# Patient Record
Sex: Female | Born: 1965
Health system: Southern US, Community
[De-identification: ages and names within clinical notes are randomized; demographics above are authoritative.]

## PROBLEM LIST (undated history)

## (undated) DIAGNOSIS — T7840XA Allergy, unspecified, initial encounter: Secondary | ICD-10-CM

## (undated) DIAGNOSIS — E785 Hyperlipidemia, unspecified: Secondary | ICD-10-CM

## (undated) DIAGNOSIS — I10 Essential (primary) hypertension: Secondary | ICD-10-CM

## (undated) DIAGNOSIS — G43909 Migraine, unspecified, not intractable, without status migrainosus: Secondary | ICD-10-CM

## (undated) HISTORY — DX: Allergy, unspecified, initial encounter: T78.40XA

## (undated) HISTORY — DX: Hyperlipidemia, unspecified: E78.5

## (undated) HISTORY — DX: Essential (primary) hypertension: I10

---

## 1998-12-31 ENCOUNTER — Inpatient Hospital Stay (HOSPITAL_COMMUNITY): Admission: AD | Admit: 1998-12-31 | Discharge: 1999-01-03 | Payer: Self-pay | Admitting: Obstetrics and Gynecology

## 2000-07-30 ENCOUNTER — Other Ambulatory Visit: Admission: RE | Admit: 2000-07-30 | Discharge: 2000-07-30 | Payer: Self-pay | Admitting: Obstetrics and Gynecology

## 2000-12-17 ENCOUNTER — Other Ambulatory Visit: Admission: RE | Admit: 2000-12-17 | Discharge: 2000-12-17 | Payer: Self-pay | Admitting: Obstetrics and Gynecology

## 2001-01-13 ENCOUNTER — Inpatient Hospital Stay (HOSPITAL_COMMUNITY): Admission: AD | Admit: 2001-01-13 | Discharge: 2001-01-13 | Payer: Self-pay | Admitting: Obstetrics and Gynecology

## 2001-01-20 ENCOUNTER — Inpatient Hospital Stay (HOSPITAL_COMMUNITY): Admission: AD | Admit: 2001-01-20 | Discharge: 2001-01-23 | Payer: Self-pay | Admitting: Obstetrics and Gynecology

## 2004-03-14 ENCOUNTER — Other Ambulatory Visit: Admission: RE | Admit: 2004-03-14 | Discharge: 2004-03-14 | Payer: Self-pay | Admitting: Obstetrics and Gynecology

## 2012-05-25 ENCOUNTER — Other Ambulatory Visit: Payer: Self-pay | Admitting: Internal Medicine

## 2012-05-25 DIAGNOSIS — N644 Mastodynia: Secondary | ICD-10-CM

## 2012-05-31 ENCOUNTER — Ambulatory Visit
Admission: RE | Admit: 2012-05-31 | Discharge: 2012-05-31 | Disposition: A | Discharge status: Home / Self Care | Payer: BC Managed Care – PPO | Source: Ambulatory Visit | Attending: Internal Medicine | Admitting: Internal Medicine

## 2012-05-31 DIAGNOSIS — N644 Mastodynia: Secondary | ICD-10-CM

## 2014-09-20 ENCOUNTER — Ambulatory Visit (HOSPITAL_COMMUNITY)
Admission: RE | Admit: 2014-09-20 | Discharge: 2014-09-20 | Disposition: A | Discharge status: Home / Self Care | Payer: BC Managed Care – PPO | Source: Ambulatory Visit | Attending: Emergency Medicine | Admitting: Emergency Medicine

## 2014-09-20 ENCOUNTER — Ambulatory Visit (INDEPENDENT_AMBULATORY_CARE_PROVIDER_SITE_OTHER): Payer: BC Managed Care – PPO | Admitting: Emergency Medicine

## 2014-09-20 ENCOUNTER — Emergency Department (HOSPITAL_COMMUNITY)
Admission: EM | Admit: 2014-09-20 | Discharge: 2014-09-20 | Disposition: A | Discharge status: Home / Self Care | Payer: BC Managed Care – PPO | Attending: Emergency Medicine | Admitting: Emergency Medicine

## 2014-09-20 ENCOUNTER — Encounter (HOSPITAL_COMMUNITY): Payer: Self-pay | Admitting: Emergency Medicine

## 2014-09-20 ENCOUNTER — Other Ambulatory Visit: Payer: Self-pay | Admitting: Emergency Medicine

## 2014-09-20 ENCOUNTER — Emergency Department (HOSPITAL_COMMUNITY): Payer: BC Managed Care – PPO

## 2014-09-20 VITALS — BP 122/88 | HR 98 | Temp 97.6°F | Resp 16 | Ht 67.0 in | Wt 303.4 lb

## 2014-09-20 DIAGNOSIS — R270 Ataxia, unspecified: Secondary | ICD-10-CM

## 2014-09-20 DIAGNOSIS — I608 Other nontraumatic subarachnoid hemorrhage: Secondary | ICD-10-CM | POA: Diagnosis not present

## 2014-09-20 DIAGNOSIS — R51 Headache: Secondary | ICD-10-CM | POA: Diagnosis present

## 2014-09-20 DIAGNOSIS — G44039 Episodic paroxysmal hemicrania, not intractable: Secondary | ICD-10-CM

## 2014-09-20 DIAGNOSIS — I10 Essential (primary) hypertension: Secondary | ICD-10-CM | POA: Diagnosis not present

## 2014-09-20 DIAGNOSIS — R9402 Abnormal brain scan: Secondary | ICD-10-CM | POA: Diagnosis present

## 2014-09-20 DIAGNOSIS — E785 Hyperlipidemia, unspecified: Secondary | ICD-10-CM | POA: Diagnosis not present

## 2014-09-20 DIAGNOSIS — R519 Headache, unspecified: Secondary | ICD-10-CM

## 2014-09-20 DIAGNOSIS — Z79899 Other long term (current) drug therapy: Secondary | ICD-10-CM | POA: Insufficient documentation

## 2014-09-20 DIAGNOSIS — I609 Nontraumatic subarachnoid hemorrhage, unspecified: Secondary | ICD-10-CM

## 2014-09-20 LAB — CBC WITH DIFFERENTIAL/PLATELET
Basophils Absolute: 0.1 10*3/uL (ref 0.0–0.1)
Basophils Relative: 1 % (ref 0–1)
EOS PCT: 1 % (ref 0–5)
Eosinophils Absolute: 0.1 10*3/uL (ref 0.0–0.7)
HCT: 43.4 % (ref 36.0–46.0)
HEMOGLOBIN: 15.1 g/dL — AB (ref 12.0–15.0)
LYMPHS ABS: 2 10*3/uL (ref 0.7–4.0)
LYMPHS PCT: 25 % (ref 12–46)
MCH: 34.1 pg — ABNORMAL HIGH (ref 26.0–34.0)
MCHC: 34.8 g/dL (ref 30.0–36.0)
MCV: 98 fL (ref 78.0–100.0)
Monocytes Absolute: 0.6 10*3/uL (ref 0.1–1.0)
Monocytes Relative: 7 % (ref 3–12)
Neutro Abs: 5.2 10*3/uL (ref 1.7–7.7)
Neutrophils Relative %: 66 % (ref 43–77)
PLATELETS: 248 10*3/uL (ref 150–400)
RBC: 4.43 MIL/uL (ref 3.87–5.11)
RDW: 12.6 % (ref 11.5–15.5)
WBC: 7.8 10*3/uL (ref 4.0–10.5)

## 2014-09-20 LAB — BASIC METABOLIC PANEL
Anion gap: 15 (ref 5–15)
BUN: 18 mg/dL (ref 6–23)
CO2: 22 meq/L (ref 19–32)
Calcium: 9.9 mg/dL (ref 8.4–10.5)
Chloride: 99 mEq/L (ref 96–112)
Creatinine, Ser: 0.76 mg/dL (ref 0.50–1.10)
GFR calc Af Amer: >90 mL/min (ref >90)
GFR calc non Af Amer: >90 mL/min (ref >90)
Glucose, Bld: 182 mg/dL — ABNORMAL HIGH (ref 70–99)
POTASSIUM: 4 meq/L (ref 3.7–5.3)
Sodium: 136 mEq/L — ABNORMAL LOW (ref 137–147)

## 2014-09-20 MED ORDER — LORAZEPAM 2 MG/ML IJ SOLN
0.5000 mg | Freq: Once | INTRAMUSCULAR | Status: AC
  Administered 2014-09-20: 0.5 mg via INTRAVENOUS
  Filled 2014-09-20: qty 1

## 2014-09-20 MED ORDER — METOCLOPRAMIDE HCL 5 MG/ML IJ SOLN
5.0000 mg | Freq: Once | INTRAMUSCULAR | Status: AC
  Administered 2014-09-20: 5 mg via INTRAMUSCULAR
  Filled 2014-09-20: qty 2

## 2014-09-20 MED ORDER — IOHEXOL 350 MG/ML SOLN
100.0000 mL | Freq: Once | INTRAVENOUS | Status: AC | PRN
  Administered 2014-09-20: 100 mL via INTRAVENOUS

## 2014-09-20 MED ORDER — DIPHENHYDRAMINE HCL 50 MG/ML IJ SOLN
25.0000 mg | Freq: Once | INTRAMUSCULAR | Status: AC
  Administered 2014-09-20: 25 mg via INTRAVENOUS
  Filled 2014-09-20: qty 1

## 2014-09-20 MED ORDER — KETOROLAC TROMETHAMINE 30 MG/ML IJ SOLN
30.0000 mg | Freq: Once | INTRAMUSCULAR | Status: AC
  Administered 2014-09-20: 30 mg via INTRAVENOUS
  Filled 2014-09-20: qty 1

## 2014-09-20 MED ORDER — SODIUM CHLORIDE 0.9 % IV BOLUS (SEPSIS)
1000.0000 mL | Freq: Once | INTRAVENOUS | Status: AC
Start: 2014-09-20 — End: 2014-09-20
  Administered 2014-09-20: 1000 mL via INTRAVENOUS

## 2014-09-20 NOTE — ED Notes (Signed)
Patient transported to CT 

## 2014-09-20 NOTE — Patient Instructions (Signed)
[facility], today at 12:30 pm for outpatient MRI. Go through double door and turn right.
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
## 2014-09-20 NOTE — ED Notes (Signed)
Pt c/o right sided headache, visual disturbances for past couple of days.  Pt went to Urgent care today and was sent for MRI, the results were abnormal so pt was referred to ED for further eval.

## 2014-09-20 NOTE — ED Notes (Signed)
Prescription for migraines?

## 2014-09-20 NOTE — ED Provider Notes (Signed)
CSN: [id number]     Arrival date & time 09/20/14  1446 History   First MD Initiated Contact with Patient 09/20/14 1511     Chief Complaint  Patient presents with  . Sent here from Urgent Care due to abnormal MRI     (Consider location/radiation/quality/duration/timing/severity/associated sxs/prior Treatment) HPI   This is a 48 y/o F  With a pmh of migraine HAs. She has had years of intermittent migraine headaches with Visual disturbance in the Left eye. She states that she developed a headache on saturday on the Right side. She states that it is severe but no worse than her usual migraine headaches. Sh had associated ataxia and dizziness which are new but she attributes to her visual changes. [facility] Dr. Dareen PianoAnderson and sent for MRI which showed an apparent acute subarachnoid hemorrhage in the R parietal sulcus. She states that  She has a feeling of dread today.  Past Medical History  Diagnosis Date  . Allergy   . Hypertension   . Hyperlipidemia    History reviewed. No pertinent past surgical history. Family History  Problem Relation Age of Onset  . Cancer Mother   . Cancer Father   . Diabetes Father   . Hyperlipidemia Father   . Hypertension Father    History  Substance Use Topics  . Smoking status: Never Smoker   . Smokeless tobacco: Not on file  . Alcohol Use: 0.0 oz/week    0 Not specified per week   OB History    No data available     Review of Systems  Ten systems reviewed and are negative for acute change, except as noted in the HPI.    Allergies  Review of patient's allergies indicates no known allergies.  Home Medications   Prior to Admission medications   Medication Sig Start Date End Date Taking? Authorizing Provider  CALCIUM PO Take 2 tablets by mouth daily.   Yes Historical Provider, MD  Ibuprofen (ADVIL MIGRAINE) 200 MG CAPS Take 400 mg by mouth every 4 (four) hours as needed (migraine).   Yes Historical  Provider, MD  lisinopril-hydrochlorothiazide (PRINZIDE,ZESTORETIC) 20-25 MG per tablet Take 1 tablet by mouth daily.   Yes Historical Provider, MD  Multiple Vitamin (MULTIVITAMIN WITH MINERALS) TABS tablet Take 1 tablet by mouth daily.   Yes Historical Provider, MD  pravastatin (PRAVACHOL) 40 MG tablet Take 40 mg by mouth daily.   Yes Historical Provider, MD  Pseudoephedrine-Acetaminophen (TYLENOL SINUS MAX ST PO) Take 2 tablets by mouth every 4 (four) hours as needed (sinuses).   Yes Historical Provider, MD   BP 159/91 mmHg  Pulse 116  Temp(Src) 98.1 F (36.7 C) (Oral)  Resp 20  SpO2 98% Physical Exam  Constitutional: She is oriented to person, place, and time. She appears well-developed and well-nourished. No distress.  HENT:  Head: Normocephalic and atraumatic.  Mouth/Throat: Oropharynx is clear and moist.  Eyes: Conjunctivae and EOM are normal. Pupils are equal, round, and reactive to light. No scleral icterus.  No horizontal, vertical or rotational nystagmus  Neck: Normal range of motion. Neck supple.  Full active and passive ROM without pain No midline or paraspinal tenderness No nuchal rigidity or meningeal signs  Cardiovascular: Normal rate, regular rhythm and intact distal pulses.   Pulmonary/Chest: Effort normal and breath sounds normal. No respiratory distress. She has no wheezes. She has no rales.  Abdominal: Soft. Bowel sounds are normal. There is no tenderness. There is  no rebound and no guarding.  Musculoskeletal: Normal range of motion.  Lymphadenopathy:    She has no cervical adenopathy.  Neurological: She is alert and oriented to person, place, and time. She has normal reflexes. No cranial nerve deficit. She exhibits normal muscle tone. Coordination normal.  Mental Status:  Alert, oriented, thought content appropriate. Speech fluent without evidence of aphasia. Able to follow 2 step commands without difficulty.  Cranial Nerves:  II:  Peripheral visual fields grossly  normal, pupils equal, round, reactive to light III,IV, VI: ptosis not present, extra-ocular motions intact bilaterally  V,VII: smile symmetric, facial light touch sensation equal VIII: hearing grossly normal bilaterally  IX,X: gag reflex present  XI: bilateral shoulder shrug equal and strong XII: midline tongue extension  Motor:  5/5 in upper and lower extremities bilaterally including strong and equal grip strength and dorsiflexion/plantar flexion Sensory: Pinprick and light touch normal in all extremities.  Deep Tendon Reflexes: 2+ and symmetric  Cerebellar: normal finger-to-nose with bilateral upper extremities Gait: normal gait and balance CV: distal pulses palpable throughout   Skin: Skin is warm and dry. No rash noted. She is not diaphoretic.  Psychiatric: She has a normal mood and affect. Her behavior is normal. Judgment and thought content normal.  Nursing note and vitals reviewed.   ED Course  Procedures (including critical care time) Labs Review Labs Reviewed  CBC WITH DIFFERENTIAL  BASIC METABOLIC PANEL  URINALYSIS, ROUTINE W REFLEX MICROSCOPIC    Imaging Review Mr Brain Wo Contrast  09/20/2014   CLINICAL DATA:  Flashing lights and dizziness with ataxia. Yesterday developed severe RIGHT-sided headache which is atypical for the patient. Initial encounter.  EXAM: MRI HEAD WITHOUT CONTRAST  TECHNIQUE: Multiplanar, multiecho pulse sequences of the brain and surrounding structures were obtained without intravenous contrast.  COMPARISON:  None.  FINDINGS: There is a focus of acute subarachnoid hemorrhage within a RIGHT posterior parietal sulcus. No restricted diffusion, parenchymal hemorrhage, or visible vascular malformation. Evidence for T2 shortening on gradient sequence, but no T1 shortening, suggesting acute bleed.  No visible acute stroke, mass lesion, hydrocephalus, or extra-axial fluid.  Normal for age cerebral volume. Minor subcortical white matter signal abnormality,  nonspecific. No large vessel or lacunar infarct. Partial empty sella. No tonsillar herniation.  No sinus disease is evident.  Negative orbits.  No mastoid fluid.  IMPRESSION: Apparent acute subarachnoid hemorrhage within a RIGHT posterior parietal sulcus. Consider confirmation with CT. Etiology is undetermined. No visible intracranial vascular thrombosis, or parenchymal abnormality.  Findings discussed with ordering provider at the time of interpretation.   Electronically Signed   By: Davonna BellingJohn  Curnes M.D.   On: 09/20/2014 14:26     EKG Interpretation None      MDM   Final diagnoses:  Acute spontaneous subarachnoid intracranial hemorrhage    3:40 PM BP 159/91 mmHg  Pulse 116  Temp(Src) 98.1 F (36.7 C) (Oral)  Resp 20  SpO2 98% Dr. Wynetta Emeryram of neurosurgery has looked over the MRI ans states that this does not appear to an acute aneurysmal bleed. He requests CT/CTA. He states that this will not be a surgical case and she will need a neurology admission.    Patient CTA read as bleed, however, radiology called back to state that this is a calcification of some sort. Dr. Roseanne RenoStewart is aware and states the patient does not need admission for this. It appears it has been a long-standing issue. Patient informed of findings and will be treated with migraine cocktail.   8:01  PM Patient with resolution of her symptoms. She has a small headache now. However, there is no more. We will disturbance. She is able to walk easily in the emergency department. She will be discharged to follow up with her primary care physician.   Arthor Captain, PA-C 09/20/14 2002  Ward Givens, MD 09/20/14 [phone]

## 2014-09-20 NOTE — Discharge Instructions (Signed)
You are having a headache. No specific cause was found today for your headache. It may have been a migraine or other cause of headache. Stress, anxiety, fatigue, and depression are common triggers for headaches. Your headache today does not appear to be life-threatening or require hospitalization, but often the exact cause of headaches is not determined in the emergency department. Therefore, follow-up with your doctor is very important to find out what may have caused your headache, and whether or not you need any further diagnostic testing or treatment. Sometimes headaches can appear benign (not harmful), but then more serious symptoms can develop which should prompt an immediate re-evaluation by your doctor or the emergency department. °SEEK MEDICAL ATTENTION IF: °You develop possible problems with medications prescribed.  °The medications don't resolve your headache, if it recurs , or if you have multiple episodes of vomiting or can't take fluids. °You have a change from the usual headache. °RETURN IMMEDIATELY IF you develop a sudden, severe headache or confusion, become poorly responsive or faint, develop a fever above 100.4F or problem breathing, have a change in speech, vision, swallowing, or understanding, or develop new weakness, numbness, tingling, incoordination, or have a seizure. ° ° °Migraine Headache °A migraine headache is an intense, throbbing pain on one or both sides of your head. A migraine can last for 30 minutes to several hours. °CAUSES  °The exact cause of a migraine headache is not always known. However, a migraine may be caused when nerves in the brain become irritated and release chemicals that cause inflammation. This causes pain. °Certain things may also trigger migraines, such as: °· Alcohol. °· Smoking. °· Stress. °· Menstruation. °· Aged cheeses. °· Foods or drinks that contain nitrates, glutamate, aspartame, or tyramine. °· Lack of  sleep. °· Chocolate. °· Caffeine. °· Hunger. °· Physical exertion. °· Fatigue. °· Medicines used to treat chest pain (nitroglycerine), birth control pills, estrogen, and some blood pressure medicines. °SIGNS AND SYMPTOMS °· Pain on one or both sides of your head. °· Pulsating or throbbing pain. °· Severe pain that prevents daily activities. °· Pain that is aggravated by any physical activity. °· Nausea, vomiting, or both. °· Dizziness. °· Pain with exposure to bright lights, loud noises, or activity. °· General sensitivity to bright lights, loud noises, or smells. °Before you get a migraine, you may get warning signs that a migraine is coming (aura). An aura may include: °· Seeing flashing lights. °· Seeing bright spots, halos, or zigzag lines. °· Having tunnel vision or blurred vision. °· Having feelings of numbness or tingling. °· Having trouble talking. °· Having muscle weakness. °DIAGNOSIS  °A migraine headache is often diagnosed based on: °· Symptoms. °· Physical exam. °· A CT scan or MRI of your head. These imaging tests cannot diagnose migraines, but they can help rule out other causes of headaches. °TREATMENT °Medicines may be given for pain and nausea. Medicines can also be given to help prevent recurrent migraines.  °HOME CARE INSTRUCTIONS °· Only take over-the-counter or prescription medicines for pain or discomfort as directed by your health care provider. The use of long-term narcotics is not recommended. °· Lie down in a dark, quiet room when you have a migraine. °· Keep a journal to find out what may trigger your migraine headaches. For example, write down: °¨ What you eat and drink. °¨ How much sleep you get. °¨ Any change to your diet or medicines. °· Limit alcohol consumption. °· Quit smoking if you smoke. °· Get 7-9 hours   of sleep, or as recommended by your health care provider. °· Limit stress. °· Keep lights dim if bright lights bother you and make your migraines worse. °SEEK IMMEDIATE MEDICAL  CARE IF:  °· Your migraine becomes severe. °· You have a fever. °· You have a stiff neck. °· You have vision loss. °· You have muscular weakness or loss of muscle control. °· You start losing your balance or have trouble walking. °· You feel faint or pass out. °· You have severe symptoms that are different from your first symptoms. °MAKE SURE YOU:  °· Understand these instructions. °· Will watch your condition. °· Will get help right away if you are not doing well or get worse. °Document Released: 10/20/2005 Document Revised: 03/06/2014 Document Reviewed: 06/27/2013 °[employer]® Patient Information ©2015 [employer]. This information is not intended to replace advice given to you by your health care provider. Make sure you discuss any questions you have with your health care provider. ° °

## 2014-09-20 NOTE — ED Notes (Signed)
Bed: WA19 Expected date:  Expected time:  Means of arrival:  Comments: 

## 2014-09-20 NOTE — ED Notes (Signed)
Patient aware urine sample is needed, unable to give, will try again later.

## 2014-09-20 NOTE — Progress Notes (Signed)
[facility] [address], [place] [id number] [phone]  Date:  09/20/2014   Name:  Amber Durham   DOB:  01-19-1966   MRN:  [id number]  PCP:  No PCP Per Patient    Chief Complaint: Blurred Vision and Migraine   History of Present Illness:  Amber Durham is a 48 y.o. very pleasant female patient who presents with the following:  Patient has a history of "silent migraines" that she describes as auras and visual disturbance with no associated headache. These occur quarterly and headaches semi annually.  She had clusters of the visual symptoms over the weekend and  Yesterday developed a severe right sided headache that she says is different from her usual global headache.   She attributes this to a sinus problem in the absence of sinus complaints. Has no fever or chills, history of antecedent illness or injury. She has been ataxic today and uncoordinated by her description. She has no dizziness but feels her impairment is more vision related No improvement with over the counter medications or other home remedies.  Denies other complaint or health concern today.   There are no active problems to display for this patient.   Past Medical History  Diagnosis Date  . Allergy   . Hypertension   . Hyperlipidemia     History reviewed. No pertinent past surgical history.  History  Substance Use Topics  . Smoking status: Never Smoker   . Smokeless tobacco: Not on file  . Alcohol Use: 0.0 oz/week    0 Not specified per week    Family History  Problem Relation Age of Onset  . Cancer Mother   . Cancer Father   . Diabetes Father   . Hyperlipidemia Father   . Hypertension Father     No Known Allergies  Medication list has been reviewed and updated.  No current outpatient prescriptions on file prior to visit.   No current facility-administered medications on file prior to visit.    Review of Systems:  As per HPI, otherwise negative.     Physical Examination: Filed Vitals:   09/20/14 0958  BP: 122/88  Pulse: 98  Temp: 97.6 F (36.4 C)  Resp: 16   Filed Vitals:   09/20/14 0958  Height: 5\' 7"  (1.702 m)  Weight: 303 lb 6.4 oz (137.621 kg)   Body mass index is 47.51 kg/(m^2). Ideal Body Weight: Weight in (lb) to have BMI = 25: 159.3  GEN: morbidly obese, NAD, Non-toxic, A & O x 3 HEENT: Atraumatic, Normocephalic. Neck supple. No masses, No LAD. Ears and Nose: No external deformity. CV: RRR, No M/G/R. No JVD. No thrill. No extra heart sounds. PULM: CTA B, no wheezes, crackles, rhonchi. No retractions. No resp. distress. No accessory muscle use. ABD: S, NT, ND, +BS. No rebound. No HSM. EXTR: No c/c/e NEURO Normal gait. CN 2-12 intact,  PRRERLA EOMI romberg and tandem gait intact PSYCH: Normally interactive. Conversant. Not depressed or anxious appearing.  Calm demeanor.    Assessment and Plan: Intracranial hemorrhage To ER   Signed,  Phillips OdorJeffery Diamonte Stavely, MD   MRI IMPRESSION: Apparent acute subarachnoid hemorrhage within a RIGHT posterior parietal sulcus. Consider confirmation with CT. Etiology is undetermined. No visible intracranial vascular thrombosis, or parenchymal abnormality.

## 2014-09-20 NOTE — Consult Note (Signed)
Reason for Consult: Recurrent headache with abnormal CT scan.  HPI:                                                                                                                                          Amber Durham is an 48 y.o. female with a history of hypertension and hyperlipidemia as well as migraine headaches, presenting with headache that started 4 days ago involving right side of her head. She also had scotomas prior to the onset of headache. She's had some persistent blurring of vision on the left side. MRI of her brain was obtained which showed no signs of an acute stroke. It was abnormal signal involving the right parietal region which appeared to be possible acute hemorrhage. Subsequent CT scan of the head showed abnormal signal to be consistent with calcium deposits with no signs of acute hemorrhage. CT angiogram of the head was unremarkable.  Past Medical History  Diagnosis Date  . Allergy   . Hypertension   . Hyperlipidemia     History reviewed. No pertinent past surgical history.  Family History  Problem Relation Age of Onset  . Cancer Mother   . Cancer Father   . Diabetes Father   . Hyperlipidemia Father   . Hypertension Father     Social History:  reports that she has never smoked. She does not have any smokeless tobacco history on file. She reports that she drinks alcohol. She reports that she does not use illicit drugs.  No Known Allergies  MEDICATIONS:                                                                                                                     I have reviewed the patient's current medications.   ROS:  History obtained from the patient  General ROS: negative for - chills, fatigue, fever, night sweats, weight gain or weight loss Psychological ROS: negative for - behavioral disorder, hallucinations,  memory difficulties, mood swings or suicidal ideation Ophthalmic ROS: negative for - blurry vision, double vision, eye pain or loss of vision ENT ROS: negative for - epistaxis, nasal discharge, oral lesions, sore throat, tinnitus or vertigo Allergy and Immunology ROS: negative for - hives or itchy/watery eyes Hematological and Lymphatic ROS: negative for - bleeding problems, bruising or swollen lymph nodes Endocrine ROS: negative for - galactorrhea, hair pattern changes, polydipsia/polyuria or temperature intolerance Respiratory ROS: negative for - cough, hemoptysis, shortness of breath or wheezing Cardiovascular ROS: negative for - chest pain, dyspnea on exertion, edema or irregular heartbeat Gastrointestinal ROS: negative for - abdominal pain, diarrhea, hematemesis, nausea/vomiting or stool incontinence Genito-Urinary ROS: negative for - dysuria, hematuria, incontinence or urinary frequency/urgency Musculoskeletal ROS: negative for - joint swelling or muscular weakness Neurological ROS: as noted in HPI Dermatological ROS: negative for rash and skin lesion changes   Blood pressure 159/91, pulse 116, temperature 98.1 F (36.7 C), temperature source Oral, resp. rate 20, SpO2 98 %.   Neurologic Examination:                                                                                                      Mental Status: Alert, oriented, no acute distress.  Speech fluent without evidence of aphasia. Able to follow commands without difficulty. Cranial Nerves: II- difficulty with finger counting left visual field, otherwise unremarkable. III/IV/VI-Pupils were equal and reacted. Extraocular movements were full and conjugate.    V/VII-no facial numbness and no facial weakness. VIII-normal. X-normal speech and symmetrical palatal movement. Motor: 5/5 bilaterally with normal tone and bulk Sensory: Normal throughout. Deep Tendon Reflexes: Trace to 1+ and symmetric. Plantars: Flexor  bilaterally Cerebellar: Normal finger-to-nose testing.  No results found for: CHOL  Results for orders placed or performed during the hospital encounter of 09/20/14 (from the past 48 hour(s))  CBC with Differential     Status: Abnormal   Collection Time: 09/20/14  3:25 PM  Result Value Ref Range   WBC 7.8 4.0 - 10.5 K/uL   RBC 4.43 3.87 - 5.11 MIL/uL   Hemoglobin 15.1 (H) 12.0 - 15.0 g/dL   HCT 43.4 36.0 - 46.0 %   MCV 98.0 78.0 - 100.0 fL   MCH 34.1 (H) 26.0 - 34.0 pg   MCHC 34.8 30.0 - 36.0 g/dL   RDW 12.6 11.5 - 15.5 %   Platelets 248 150 - 400 K/uL   Neutrophils Relative % 66 43 - 77 %   Neutro Abs 5.2 1.7 - 7.7 K/uL   Lymphocytes Relative 25 12 - 46 %   Lymphs Abs 2.0 0.7 - 4.0 K/uL   Monocytes Relative 7 3 - 12 %   Monocytes Absolute 0.6 0.1 - 1.0 K/uL   Eosinophils Relative 1 0 - 5 %   Eosinophils Absolute 0.1 0.0 - 0.7 K/uL   Basophils Relative 1 0 - 1 %   Basophils Absolute  0.1 0.0 - 0.1 K/uL  Basic metabolic panel     Status: Abnormal   Collection Time: 09/20/14  3:25 PM  Result Value Ref Range   Sodium 136 (L) 137 - 147 mEq/L   Potassium 4.0 3.7 - 5.3 mEq/L   Chloride 99 96 - 112 mEq/L   CO2 22 19 - 32 mEq/L   Glucose, Bld 182 (H) 70 - 99 mg/dL   BUN 18 6 - 23 mg/dL   Creatinine, Ser 0.76 0.50 - 1.10 mg/dL   Calcium 9.9 8.4 - 10.5 mg/dL   GFR calc non Af Amer >90 >90 mL/min   GFR calc Af Amer >90 >90 mL/min    Comment: (NOTE) The eGFR has been calculated using the CKD EPI equation. This calculation has not been validated in all clinical situations. eGFR's persistently <90 mL/min signify possible Chronic Kidney Disease.    Anion gap 15 5 - 15    Ct Angio Head W/cm &/or Wo Cm  09/20/2014   CLINICAL DATA:  Subarachnoid hemorrhage demonstrated on previous MRI. Headache. Visual disturbance.  EXAM: CT ANGIOGRAPHY HEAD  TECHNIQUE: Multidetector CT imaging of the head was performed using the standard protocol during bolus administration of intravenous contrast.  Multiplanar CT image reconstructions and MIPs were obtained to evaluate the vascular anatomy.  CONTRAST:  175m OMNIPAQUE IOHEXOL 350 MG/ML SOLN  COMPARISON:  MRI same day.  FINDINGS: Images of the head show superficial calcification within the right parietal region, corresponding to the MR abnormality. Therefore, it is unlikely the patient has any acute subarachnoid hemorrhage. This chronic gyral calcification is usually associated with venous developmental anomaly and could represent a form of Sturge-Weber syndrome. However, the calcification appears more cortical and subcortical and there is not the associated finding of enlargement of the ipsilateral choroid. Therefore the differential diagnosis would include low level arteriovenous malformation, distant infection such as torch, old cortical vascular insult.  Both internal carotid arteries are widely patent through the skullbase. Both carotid siphon regions appear normal. The anterior and middle cerebral vessels are normal without proximal stenosis, aneurysm or vascular malformation. In the region of the right parietal calcification, there is no evidence of any high flow vascular lesion. I do think that there may be some prominent venous blush along some of the brain surface that is not associated with calcification, favoring the diagnosis of developmental venous anomaly.  Both vertebral arteries are widely patent to the basilar. No basilar stenosis. Posterior circulation branch vessels are normal.  No evidence of intracranial venous thrombosis of the sinuses.  Review of the MIP images confirms the above findings.  IMPRESSION: There is probably not acute subarachnoid hemorrhage in this case. The MRI finding was due to cortical calcification in the right parietal region. This is most likely due to either Sturge-Weber syndrome or developmental venous pathology with calcification. See above for full discussion. There is no evidence of high flow vascular  malformation or aneurysm.   Electronically Signed   By: MNelson ChimesM.D.   On: 09/20/2014 17:18   Ct Head Wo Contrast  09/20/2014   ADDENDUM REPORT: 09/20/2014 17:34  ADDENDUM: Upon further review, it is felt that the density of the abnormality in the right posterior parietal cortex is most consistent with calcification instead of hemorrhage. Please refer to CTA report of same day for further discussion.   Electronically Signed   By: JSabino DickM.D.   On: 09/20/2014 17:34   09/20/2014   CLINICAL DATA:  Acute spontaneous subarachnoid intracranial  hemorrhage.  EXAM: CT HEAD WITHOUT CONTRAST  TECHNIQUE: Contiguous axial images were obtained from the base of the skull through the vertex without intravenous contrast.  COMPARISON:  MRI scan of same day.  FINDINGS: Bony calvarium appears intact. No mass effect or midline shift is noted. High density material is noted in the sulci of the right posterior parietal cortex consistent with subarachnoid hemorrhage. Ventricular size is within normal limits. No evidence of acute infarction or mass is noted.  IMPRESSION: Subarachnoid hemorrhage is noted in the right posterior parietal cortex as described on prior MRI. No mass effect or midline shift is noted. Critical Value/emergent results were called by telephone at the time of interpretation on 09/20/2014 at 5:09 pm to Dr. Rulon Abide, who verbally acknowledged these results.  Electronically Signed: By: Sabino Dick M.D. On: 09/20/2014 17:10   Mr Brain Wo Contrast  09/20/2014   CLINICAL DATA:  Flashing lights and dizziness with ataxia. Yesterday developed severe RIGHT-sided headache which is atypical for the patient. Initial encounter.  EXAM: MRI HEAD WITHOUT CONTRAST  TECHNIQUE: Multiplanar, multiecho pulse sequences of the brain and surrounding structures were obtained without intravenous contrast.  COMPARISON:  None.  FINDINGS: There is a focus of acute subarachnoid hemorrhage within a RIGHT posterior parietal  sulcus. No restricted diffusion, parenchymal hemorrhage, or visible vascular malformation. Evidence for T2 shortening on gradient sequence, but no T1 shortening, suggesting acute bleed.  No visible acute stroke, mass lesion, hydrocephalus, or extra-axial fluid.  Normal for age cerebral volume. Minor subcortical white matter signal abnormality, nonspecific. No large vessel or lacunar infarct. Partial empty sella. No tonsillar herniation.  No sinus disease is evident.  Negative orbits.  No mastoid fluid.  IMPRESSION: Apparent acute subarachnoid hemorrhage within a RIGHT posterior parietal sulcus. Consider confirmation with CT. Etiology is undetermined. No visible intracranial vascular thrombosis, or parenchymal abnormality.  Findings discussed with ordering provider at the time of interpretation.   Electronically Signed   By: Rolla Flatten M.D.   On: 09/20/2014 14:26   Assessment/Plan: 48 year old lady with history of migraine headaches presenting with a recurrent headache with imaging studies of the brain showing findings consistent with chronic focal calcification involving right parietal region. Duration of calcium deposits is unclear and possibly even congenital. Patient has no indication of intracranial abnormality otherwise. Blurring of vision involving left visual field is atypical feature of her migraine headache symptomatology.  No further neurodiagnostic studies are indicated. Recommend symptomatic treatment of recurrent headache.  C.R. Nicole Kindred, MD [facility] [phone]  09/20/2014, 5:38 PM

## 2014-09-21 ENCOUNTER — Telehealth: Payer: Self-pay

## 2014-09-21 ENCOUNTER — Encounter (HOSPITAL_COMMUNITY): Payer: Self-pay | Admitting: *Deleted

## 2014-09-21 ENCOUNTER — Other Ambulatory Visit: Payer: Self-pay | Admitting: Emergency Medicine

## 2014-09-21 ENCOUNTER — Emergency Department (HOSPITAL_COMMUNITY)
Admission: EM | Admit: 2014-09-21 | Discharge: 2014-09-21 | Disposition: A | Discharge status: Home / Self Care | Payer: BC Managed Care – PPO | Attending: Emergency Medicine | Admitting: Emergency Medicine

## 2014-09-21 DIAGNOSIS — G43009 Migraine without aura, not intractable, without status migrainosus: Secondary | ICD-10-CM

## 2014-09-21 DIAGNOSIS — Z79899 Other long term (current) drug therapy: Secondary | ICD-10-CM | POA: Insufficient documentation

## 2014-09-21 DIAGNOSIS — E785 Hyperlipidemia, unspecified: Secondary | ICD-10-CM | POA: Diagnosis not present

## 2014-09-21 DIAGNOSIS — G43909 Migraine, unspecified, not intractable, without status migrainosus: Secondary | ICD-10-CM | POA: Insufficient documentation

## 2014-09-21 DIAGNOSIS — I10 Essential (primary) hypertension: Secondary | ICD-10-CM | POA: Insufficient documentation

## 2014-09-21 HISTORY — DX: Migraine, unspecified, not intractable, without status migrainosus: G43.909

## 2014-09-21 MED ORDER — SODIUM CHLORIDE 0.9 % IV BOLUS (SEPSIS)
1000.0000 mL | Freq: Once | INTRAVENOUS | Status: AC
  Administered 2014-09-21: 1000 mL via INTRAVENOUS

## 2014-09-21 MED ORDER — DIPHENHYDRAMINE HCL 50 MG/ML IJ SOLN
50.0000 mg | Freq: Once | INTRAMUSCULAR | Status: AC
  Administered 2014-09-21: 50 mg via INTRAVENOUS
  Filled 2014-09-21: qty 1

## 2014-09-21 MED ORDER — KETOROLAC TROMETHAMINE 30 MG/ML IJ SOLN
30.0000 mg | Freq: Once | INTRAMUSCULAR | Status: AC
  Administered 2014-09-21: 30 mg via INTRAVENOUS
  Filled 2014-09-21: qty 1

## 2014-09-21 MED ORDER — METOCLOPRAMIDE HCL 5 MG/ML IJ SOLN
10.0000 mg | Freq: Once | INTRAMUSCULAR | Status: AC
  Administered 2014-09-21: 10 mg via INTRAVENOUS
  Filled 2014-09-21: qty 2

## 2014-09-21 MED ORDER — DEXAMETHASONE SODIUM PHOSPHATE 10 MG/ML IJ SOLN
10.0000 mg | Freq: Once | INTRAMUSCULAR | Status: AC
  Administered 2014-09-21: 10 mg via INTRAVENOUS
  Filled 2014-09-21: qty 1

## 2014-09-21 MED ORDER — ONDANSETRON 8 MG PO TBDP: 8.0000 mg | ORAL_TABLET | Freq: Three times a day (TID) | ORAL | Status: DC | PRN

## 2014-09-21 MED ORDER — SUMATRIPTAN SUCCINATE 100 MG PO TABS: 100.0000 mg | ORAL_TABLET | Freq: Once | ORAL | Status: DC

## 2014-09-21 MED ORDER — DICLOFENAC POTASSIUM(MIGRAINE) 50 MG PO PACK: 50.0000 mg | PACK | Freq: Every day | ORAL | Status: DC | PRN

## 2014-09-21 MED ORDER — TETRACAINE HCL 0.5 % OP SOLN
1.0000 [drp] | Freq: Once | OPHTHALMIC | Status: AC
  Administered 2014-09-21: 1 [drp] via OPHTHALMIC
  Filled 2014-09-21: qty 2

## 2014-09-21 NOTE — ED Notes (Signed)
Pt c/o visual color flashes and "borders".  Pt states she was [facility] and discharged after CT and MRI showed calcification that was the probable cause of s/s.  She knows that what she is experiencing is a migraine, but she can't function, b/c she can barely walk d/t visual changes.

## 2014-09-21 NOTE — Telephone Encounter (Signed)
done

## 2014-09-21 NOTE — Telephone Encounter (Signed)
Dr Dareen PianoAnderson, please review.

## 2014-09-21 NOTE — Telephone Encounter (Signed)
Thank you, Dr Dareen PianoAnderson. I see the Rxs for zofran and Imitrex, do you also want to Rx a preventative medication for migraines as req'd?

## 2014-09-21 NOTE — Telephone Encounter (Signed)
No not for headaches that only come quarterly.  Why not make an appt with neuro

## 2014-09-21 NOTE — Telephone Encounter (Signed)
PATIENT STATES SHE CAME IN AND SAW DR. Nelia Durham YESTERDAY FOR DIZZINESS. HE SENT HER TO HAVE A MRI OF HER BRAIN. SHE DID NOT KNOW IF THEY GAVE DR. ANDERSON THE RESULTS YET, BUT SHE DIDN'T HAVE BLEEDING ON HER BRAIN. IT WAS CALCIUM. THEY TOLD HER THAT DR. ANDERSON NEEDS TO CALL HER IN A PREVENTATIVE MIGRAINE MEDICINE AND AN ABORTIVE. PLEASE CALL HER BACK AS SOON AS POSSIBLE. BEST PHONE [phone] (HOME)  PHARMACY CHOICE IS [facility] ON [address].  MBC

## 2014-09-21 NOTE — Telephone Encounter (Signed)
Spoke w/pt who is back in the ED being seen again. She hopes that they will have a neurologist see her today but agreed to CB if she needs a referral. Advised about Rxs.

## 2014-09-21 NOTE — ED Provider Notes (Signed)
TIME SEEN: 6:28 PM  CHIEF COMPLAINT: Headache  HPI: Pt is a 48 y.o. F with history of hypertension, hyperlipidemia, migraines who presents to the emergency department with complaints of 2 days of a right-sided throbbing headache that is similar to her prior migraines. She states she has had migraines and she was 48 years old and she will have episodes of seeing flashing colors, borders around her vision and have some spots of vision loss. She states this is normal for her migraines. Yesterday patient had an MRI of her brain that was concerning for possible subarachnoid hemorrhage. CT angio of her head showed that this was actually calcification and there was no bleed. She was seen at that time by Dr. Roseanne RenoStewart of neurology who agreed that these were complicated migraines and recommended since that treatment. Patient received medications and reported her symptoms have improved and now her headache is worse and she is unable to function because of her visual changes. No recent head injury, fevers, neck pain or neck stiffness, numbness, tingling or focal weakness.  She reports that ibuprofen is normally sufficient to manage her headaches. She states she has had Imitrex in the past and it made her feel worse.  ROS: See HPI Constitutional: no fever  Eyes: no drainage  ENT: no runny nose   Cardiovascular:  no chest pain  Resp: no SOB  GI: no vomiting GU: no dysuria Integumentary: no rash  Allergy: no hives  Musculoskeletal: no leg swelling  Neurological: no slurred speech ROS otherwise negative  PAST MEDICAL HISTORY/PAST SURGICAL HISTORY:  Past Medical History  Diagnosis Date  . Allergy   . Hypertension   . Hyperlipidemia   . Migraines     MEDICATIONS:  Prior to Admission medications   Medication Sig Start Date End Date Taking? Authorizing Provider  CALCIUM PO Take 2 tablets by mouth daily.   Yes Historical Provider, MD  Ibuprofen (ADVIL MIGRAINE) 200 MG CAPS Take 400 mg by mouth every 4  (four) hours as needed (migraine).   Yes Historical Provider, MD  lisinopril-hydrochlorothiazide (PRINZIDE,ZESTORETIC) 20-25 MG per tablet Take 1 tablet by mouth daily.   Yes Historical Provider, MD  Multiple Vitamin (MULTIVITAMIN WITH MINERALS) TABS tablet Take 1 tablet by mouth daily.   Yes Historical Provider, MD  ondansetron (ZOFRAN-ODT) 8 MG disintegrating tablet Take 1 tablet (8 mg total) by mouth every 8 (eight) hours as needed for nausea. 09/21/14  Yes Carmelina DaneJeffery S Anderson, MD  pravastatin (PRAVACHOL) 40 MG tablet Take 40 mg by mouth daily.   Yes Historical Provider, MD  Pseudoephedrine-Acetaminophen (TYLENOL SINUS MAX ST PO) Take 2 tablets by mouth every 4 (four) hours as needed (sinuses).   Yes Historical Provider, MD  SUMAtriptan (IMITREX) 100 MG tablet Take 1 tablet (100 mg total) by mouth once. May repeat in 2 hours if headache persists or recurs. 09/21/14  Yes Carmelina DaneJeffery S Anderson, MD    ALLERGIES:  No Known Allergies  SOCIAL HISTORY:  History  Substance Use Topics  . Smoking status: Never Smoker   . Smokeless tobacco: Not on file  . Alcohol Use: 0.0 oz/week    0 Not specified per week    FAMILY HISTORY: Family History  Problem Relation Age of Onset  . Cancer Mother   . Cancer Father   . Diabetes Father   . Hyperlipidemia Father   . Hypertension Father     EXAM: BP 121/80 mmHg  Pulse 105  Temp(Src) 98.7 F (37.1 C) (Oral)  Resp 16  Ht  5\' 7"  (1.702 m)  Wt 306 lb (138.801 kg)  BMI 47.92 kg/m2  SpO2 97% CONSTITUTIONAL: Alert and oriented and responds appropriately to questions. Well-appearing; well-nourished HEAD: Normocephalic EYES: Conjunctivae clear, PERRL; no photophobia, ask her Movements intact and painless, no pain with consensual light response, funduscopic exam appears normal with no papilledema or obvious hemorrhage, intraocular pressure in the right eye is 14 mmHg, intraocular pressure in the left eye is 12 mmHg ENT: normal nose; no rhinorrhea; moist  mucous membranes; pharynx without lesions noted NECK: Supple, no meningismus, no LAD  CARD: RRR; S1 and S2 appreciated; no murmurs, no clicks, no rubs, no gallops RESP: Normal chest excursion without splinting or tachypnea; breath sounds clear and equal bilaterally; no wheezes, no rhonchi, no rales,  ABD/GI: Normal bowel sounds; non-distended; soft, non-tender, no rebound, no guarding BACK:  The back appears normal and is non-tender to palpation, there is no CVA tenderness EXT: Normal ROM in all joints; non-tender to palpation; no edema; normal capillary refill; no cyanosis    SKIN: Normal color for age and race; warm NEURO: Moves all extremities equally; cranial nerves II through XII intact, no dysmetria to finger to nose testing, normal gait, sensation to light touch intact diffusely, strength 5/5 in all 4 extremities, normal visual fields PSYCH: The patient's mood and manner are appropriate. Grooming and personal hygiene are appropriate.  MEDICAL DECISION MAKING: Pt here with complicated migraine. She is neurologically intact. Hemodynamically stable. She has had recent head imaging yesterday that was concerning for possible hemorrhage although a CT angio showed that the area possible bleed was actually an area of calcification. She was seen by neurology. She is back today because she is unable to control her headache at home.  She is very well-appearing, smiling, nontoxic, neurologically intact. I do not feel she needs repeat head imaging today. She states that she has had exact same headaches since she was 48 years old the only difference today is that it is lasting 2 days when normally she is able to get rid of them within several hours with ibuprofen. We'll give Decadron, IV fluids, Toradol, Reglan and Benadryl and reassess.  ED PROGRESS: 8:11 PM  Pt reports her headache is completely gone but she is still having bilateral flashing colors, spots in vision loss. Denies eye pain or eye injury. Her  funduscopic exam appears normal with no papilledema or obvious hemorrhage. I doubt that this is ophthalmological emergency given his bilateral and more likely related to her migraines.  She states the symptoms are similar to vision changes that she is having prior migraines before but she is unable to drive, function, ambulate normally because of her vision changes. We'll discuss with neurology for further recommendations. We'll check eye pressures and visual acuity.   8:39 PM  Pt reports that her vision changes have slightly improved and they are intermittent, lasting several seconds and then resolving. She does state she has "blind spots" in both eyes and she will have flashing lights intermittently. Her eye pressures are 12 and 14.   9:08 PM  Pt able to ambulate without any difficulty or needing assistance.  9:23 PM  D/w Dr. Hosie PoissonSumner for recommendations for symptomatically treatment. He recommends starting the patient on cambia as needed for headaches. He recommends outpatient neurology follow-up as she may need to be started on preventative medications for her headaches. Have discussed with patient again that I recommend follow-up with her ophthalmologist this week. I do not feel there is any current ophthalmologic  emergency. She states she is feeling better. We'll discharge home.  Layla Maw Ward, DO 09/21/14 2124

## 2014-09-21 NOTE — Discharge Instructions (Signed)

## 2014-09-21 NOTE — Telephone Encounter (Signed)
Pt would also like something for nausea

## 2014-09-22 ENCOUNTER — Ambulatory Visit (INDEPENDENT_AMBULATORY_CARE_PROVIDER_SITE_OTHER): Payer: BC Managed Care – PPO | Admitting: Emergency Medicine

## 2014-09-22 ENCOUNTER — Telehealth: Payer: Self-pay

## 2014-09-22 VITALS — BP 130/92 | HR 84 | Temp 98.3°F | Resp 16 | Ht 64.0 in | Wt 296.0 lb

## 2014-09-22 DIAGNOSIS — G43109 Migraine with aura, not intractable, without status migrainosus: Secondary | ICD-10-CM

## 2014-09-22 MED ORDER — TOPIRAMATE 50 MG PO TABS: ORAL_TABLET | ORAL | Status: AC

## 2014-09-22 MED ORDER — DICLOFENAC SODIUM 75 MG PO TBEC: DELAYED_RELEASE_TABLET | ORAL | Status: DC

## 2014-09-22 NOTE — Progress Notes (Signed)
[facility] [address], [place] [address] [phone]  Date:  09/22/2014   Name:  Amber StarksCamilla E Durham   DOB:  07/26/1966   MRN:  [id number]  PCP:  No PCP Per Patient    Chief Complaint: Follow-up   History of Present Illness:  Amber StarksCamilla E Durham is a 48 y.o. very pleasant female patient who presents with the following:  History of complicated visual migraines.  Seen and sent for a MRI and thought to have a SAH.  Subsequently to the ER and had a CT followed by a CTA which was negative She was seen by a neurologist in the ER who recommended outpatient followup She has found relief of her pain with imitrex but is still bothered by what sounds like a cluster of visual migraines At the recommendation of the neurologist, she was started on topamax.  Patient Active Problem List   Diagnosis Date Noted  . Abnormal brain scan 09/20/2014  . Essential hypertension 09/20/2014  . HA (headache)     Past Medical History  Diagnosis Date  . Allergy   . Hypertension   . Hyperlipidemia   . Migraines     History reviewed. No pertinent past surgical history.  History  Substance Use Topics  . Smoking status: Never Smoker   . Smokeless tobacco: Not on file  . Alcohol Use: 0.0 oz/week    0 Not specified per week    Family History  Problem Relation Age of Onset  . Cancer Mother   . Cancer Father   . Diabetes Father   . Hyperlipidemia Father   . Hypertension Father     No Known Allergies  Medication list has been reviewed and updated.  Current Outpatient Prescriptions on File Prior to Visit  Medication Sig Dispense Refill  . CALCIUM PO Take 2 tablets by mouth daily.    . Diclofenac Potassium (CAMBIA) 50 MG PACK Take 50 mg by mouth daily as needed (headache). 1 each 1  . Ibuprofen (ADVIL MIGRAINE) 200 MG CAPS Take 400 mg by mouth every 4 (four) hours as needed (migraine).    Marland Kitchen. lisinopril-hydrochlorothiazide (PRINZIDE,ZESTORETIC) 20-25 MG per tablet Take 1  tablet by mouth daily.    . Multiple Vitamin (MULTIVITAMIN WITH MINERALS) TABS tablet Take 1 tablet by mouth daily.    . ondansetron (ZOFRAN-ODT) 8 MG disintegrating tablet Take 1 tablet (8 mg total) by mouth every 8 (eight) hours as needed for nausea. 30 tablet 0  . pravastatin (PRAVACHOL) 40 MG tablet Take 40 mg by mouth daily.    . Pseudoephedrine-Acetaminophen (TYLENOL SINUS MAX ST PO) Take 2 tablets by mouth every 4 (four) hours as needed (sinuses).    . SUMAtriptan (IMITREX) 100 MG tablet Take 1 tablet (100 mg total) by mouth once. May repeat in 2 hours if headache persists or recurs. 10 tablet 0   No current facility-administered medications on file prior to visit.    Review of Systems:  As per HPI, otherwise negative.    Physical Examination: Filed Vitals:   09/22/14 1432  BP: 130/92  Pulse: 84  Temp: 98.3 F (36.8 C)  Resp: 16   Filed Vitals:   09/22/14 1432  Height: 5\' 4"  (1.626 m)  Weight: 296 lb (134.265 kg)   Body mass index is 50.78 kg/(m^2). Ideal Body Weight: Weight in (lb) to have BMI = 25: 145.3   GEN: WDWN, NAD, Non-toxic, Alert & Oriented x 3 HEENT: Atraumatic, Normocephalic.  Ears and Nose: No  external deformity. EXTR: No clubbing/cyanosis/edema NEURO: Normal gait.  PSYCH: Normally interactive. Conversant. Not depressed or anxious appearing.  Calm demeanor.    Assessment and Plan: Visual migraines Follow up with ophth and neurology Start topamax increasing stepwise to 200 bid. Follow up in one month  Signed,  Phillips OdorJeffery Tyshawna Alarid, MD

## 2014-09-22 NOTE — Patient Instructions (Signed)

## 2014-09-22 NOTE — Telephone Encounter (Signed)
Pt called in and states she is in the hospital and the Neurologist there wants Dr Dareen PianoAnderson to prescribe a preventive migraine medication for her. And she also needs a referral for an out pt Nuerologist.She can be reached @ [phone]. Thank you

## 2014-09-22 NOTE — Telephone Encounter (Signed)
Pt advised to come into the office to discuss medication. Pt will be in today.

## 2014-10-02 ENCOUNTER — Telehealth: Payer: Self-pay

## 2014-10-02 NOTE — Telephone Encounter (Signed)
Pt states she had blood work done and think she may need some BP and CH0L medicine called in. Please call pt at [phone]     [facility] [address]

## 2014-10-02 NOTE — Telephone Encounter (Signed)
Pt states she was told she had to come in, but would like to have at least 5 pills until she is able to come in on Friday. Please call [phone]     [facility] [address]

## 2014-10-02 NOTE — Telephone Encounter (Signed)
LM for pt to RTC-  

## 2014-10-02 NOTE — Telephone Encounter (Signed)
Last OV for pt was in regards to migraine no labs drawn- LM to advise pt to RTC

## 2014-10-24 ENCOUNTER — Telehealth: Payer: Self-pay | Admitting: Neurology

## 2014-10-24 NOTE — Telephone Encounter (Signed)
Pt has already seen someone and canceled appt fro 10-26-14

## 2014-10-26 ENCOUNTER — Ambulatory Visit: Payer: BC Managed Care – PPO | Admitting: Neurology

## 2014-10-28 ENCOUNTER — Ambulatory Visit (INDEPENDENT_AMBULATORY_CARE_PROVIDER_SITE_OTHER): Payer: BC Managed Care – PPO | Admitting: Family Medicine

## 2014-10-28 VITALS — BP 128/80 | HR 92 | Temp 99.1°F | Resp 18 | Ht 65.0 in | Wt 302.0 lb

## 2014-10-28 DIAGNOSIS — B360 Pityriasis versicolor: Secondary | ICD-10-CM

## 2014-10-28 DIAGNOSIS — R739 Hyperglycemia, unspecified: Secondary | ICD-10-CM

## 2014-10-28 DIAGNOSIS — E785 Hyperlipidemia, unspecified: Secondary | ICD-10-CM

## 2014-10-28 DIAGNOSIS — E118 Type 2 diabetes mellitus with unspecified complications: Secondary | ICD-10-CM

## 2014-10-28 DIAGNOSIS — I10 Essential (primary) hypertension: Secondary | ICD-10-CM

## 2014-10-28 DIAGNOSIS — G43809 Other migraine, not intractable, without status migrainosus: Secondary | ICD-10-CM

## 2014-10-28 LAB — POCT CBC
Granulocyte percent: 52.7 %G (ref 37–80)
HEMATOCRIT: 43.5 % (ref 37.7–47.9)
HEMOGLOBIN: 14.9 g/dL (ref 12.2–16.2)
LYMPH, POC: 2.7 (ref 0.6–3.4)
MCH: 33.9 pg — AB (ref 27–31.2)
MCHC: 34.3 g/dL (ref 31.8–35.4)
MCV: 99.1 fL — AB (ref 80–97)
MID (cbc): 0.5 (ref 0–0.9)
MPV: 6.7 fL (ref 0–99.8)
POC Granulocyte: 3.6 (ref 2–6.9)
POC LYMPH PERCENT: 39.5 %L (ref 10–50)
POC MID %: 7.8 % (ref 0–12)
Platelet Count, POC: 278 10*3/uL (ref 142–424)
RBC: 4.39 M/uL (ref 4.04–5.48)
RDW, POC: 12.6 %
WBC: 6.8 10*3/uL (ref 4.6–10.2)

## 2014-10-28 LAB — POCT GLYCOSYLATED HEMOGLOBIN (HGB A1C): Hemoglobin A1C: 7.7

## 2014-10-28 LAB — GLUCOSE, POCT (MANUAL RESULT ENTRY): POC Glucose: 174 mg/dl — AB (ref 70–99)

## 2014-10-28 MED ORDER — METFORMIN HCL 500 MG PO TABS: 500.0000 mg | ORAL_TABLET | Freq: Two times a day (BID) | ORAL | Status: AC

## 2014-10-28 MED ORDER — PRAVASTATIN SODIUM 40 MG PO TABS: 40.0000 mg | ORAL_TABLET | Freq: Every day | ORAL | Status: AC

## 2014-10-28 MED ORDER — LISINOPRIL-HYDROCHLOROTHIAZIDE 20-25 MG PO TABS: 1.0000 | ORAL_TABLET | Freq: Every day | ORAL | Status: AC

## 2014-10-28 MED ORDER — ONETOUCH ULTRA SYSTEM W/DEVICE KIT: 1.0000 | PACK | Freq: Once | Status: AC

## 2014-10-28 NOTE — Patient Instructions (Addendum)
Read the American diabetic Association website  You can look up tinea versicolor and see, but I think it is worth trying a little lunch when cream on on the rash every day for 3 or 4 weeks. The skin may not return to a normal pigmented color for several months sometimes but after 3 or 4 weeks the infection all to be well resolved.  Continue taking your aspirin daily, cholesterol medication, and blood pressure medicine as they help protect your body from adverse effects of the diabetes.  Turn in about 6 weeks to see Dr. Dareen PianoAnderson, sooner if problems  Begin metformin 1 each morning for a few days, then one twice daily for a couple of weeks, then 2 in the morning and one in the evening, then 2 twice daily.  Check sugar fasting in the morning and 2-3 hours after a main meal. You can find cell phone to keep a record of this and show your physician your numbers

## 2014-10-28 NOTE — Progress Notes (Signed)
Subjective: Patient is here for a refill of her cholesterol and blood pressure medication. She's been doing pretty well except for her migraine problems. She was in the hospital. She had a couple of CT scans and MRI. They thought she had an intracranial bleed, but it turned out it was a venous malformation. She is seeing a neurologist for this. [facility] 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 She has never been told. I went back and reviewed her old records and labs that she had earlier last month showed an elevated glucose. She does not get regular exercise much. She does do some walking. She likes to eat, but does not like a lot of sugar. She was told that the place on the chest wall might be a caf au lait spot. She has not had it all of her life. Still has some dizziness   Objective: Rash at the manubrium area of the chest, looks like it could be tinea versicolor. Her eyes are PERRLA. Throat clear. Neck supple without nodes thyromegaly. No carotid bruits. Chest is clear to auscultation. Heart regular without murmurs gallops or arrhythmias. No edema.  Assessment: Hypertension Hyperlipidemia Hyperglycemia Atypical migraines Venous malformation in the brain  Plan: Check labs  Results for orders placed or performed in visit on 10/28/14  POCT CBC  Result Value Ref Range   WBC 6.8 4.6 - 10.2 K/uL   Lymph, poc 2.7 0.6 - 3.4   POC LYMPH PERCENT 39.5 10 - 50 %L   MID (cbc) 0.5 0 - 0.9   POC MID % 7.8 0 - 12 %M   POC Granulocyte 3.6 2 - 6.9   Granulocyte percent 52.7 37 - 80 %G   RBC 4.39 4.04 - 5.48 M/uL   Hemoglobin 14.9 12.2 - 16.2 g/dL   HCT, POC 98.143.5 19.137.7 - 47.9 %   MCV 99.1 (A) 80 - 97 fL   MCH, POC 33.9 (A) 27 - 31.2 pg   MCHC 34.3 31.8 - 35.4 g/dL   RDW, POC 47.812.6 %   Platelet Count, POC 278 142 - 424 K/uL   MPV 6.7 0 - 99.8 fL  POCT glucose (manual entry)  Result Value Ref Range   POC Glucose 174 (A) 70 - 99 mg/dl  POCT glycosylated hemoglobin  (Hb A1C)  Result Value Ref Range   Hemoglobin A1C 7.7    Assessment: New-onset diabetes (newly diagnosed) Obesity Possible tinea versicolor  Plan: A long talk with her about diabetes want to read exercise and diet. We'll get her on metformin. She chooses see Dr. Dareen PianoAnderson as her PCP here, and was told her that would be fine. [facility]    I think that that spot on the chest wall is tinea versicolor possibly suggested try using some Lotrimin cream on that and see if it would clear. Be caf au lait, but doesn't look like typical.

## 2014-10-29 LAB — LIPID PANEL
CHOL/HDL RATIO: 4.1 ratio
Cholesterol: 219 mg/dL — ABNORMAL HIGH (ref 0–200)
HDL: 53 mg/dL (ref >39)
Triglycerides: 422 mg/dL — ABNORMAL HIGH (ref <150)

## 2014-10-29 LAB — COMPLETE METABOLIC PANEL WITH GFR
ALK PHOS: 106 U/L (ref 39–117)
ALT: 68 U/L — ABNORMAL HIGH (ref 0–35)
AST: 33 U/L (ref 0–37)
Albumin: 4.3 g/dL (ref 3.5–5.2)
BILIRUBIN TOTAL: 0.5 mg/dL (ref 0.2–1.2)
BUN: 17 mg/dL (ref 6–23)
CO2: 26 mEq/L (ref 19–32)
Calcium: 9.8 mg/dL (ref 8.4–10.5)
Chloride: 100 mEq/L (ref 96–112)
Creat: 0.98 mg/dL (ref 0.50–1.10)
GFR, Est African American: 78 mL/min
GFR, Est Non African American: 68 mL/min
Glucose, Bld: 189 mg/dL — ABNORMAL HIGH (ref 70–99)
Potassium: 3.9 mEq/L (ref 3.5–5.3)
SODIUM: 136 meq/L (ref 135–145)
TOTAL PROTEIN: 6.8 g/dL (ref 6.0–8.3)

## 2014-10-30 ENCOUNTER — Encounter: Payer: Self-pay | Admitting: Family Medicine

## 2015-08-30 ENCOUNTER — Encounter (HOSPITAL_BASED_OUTPATIENT_CLINIC_OR_DEPARTMENT_OTHER): Payer: Self-pay

## 2015-08-30 ENCOUNTER — Emergency Department (HOSPITAL_BASED_OUTPATIENT_CLINIC_OR_DEPARTMENT_OTHER)
Admission: EM | Admit: 2015-08-30 | Discharge: 2015-08-30 | Disposition: A | Discharge status: Home / Self Care | Payer: BLUE CROSS/BLUE SHIELD | Attending: Emergency Medicine | Admitting: Emergency Medicine

## 2015-08-30 ENCOUNTER — Emergency Department (HOSPITAL_BASED_OUTPATIENT_CLINIC_OR_DEPARTMENT_OTHER): Payer: BLUE CROSS/BLUE SHIELD

## 2015-08-30 DIAGNOSIS — E785 Hyperlipidemia, unspecified: Secondary | ICD-10-CM | POA: Insufficient documentation

## 2015-08-30 DIAGNOSIS — Z79899 Other long term (current) drug therapy: Secondary | ICD-10-CM | POA: Insufficient documentation

## 2015-08-30 DIAGNOSIS — R1011 Right upper quadrant pain: Secondary | ICD-10-CM | POA: Diagnosis present

## 2015-08-30 DIAGNOSIS — G43909 Migraine, unspecified, not intractable, without status migrainosus: Secondary | ICD-10-CM | POA: Insufficient documentation

## 2015-08-30 DIAGNOSIS — I10 Essential (primary) hypertension: Secondary | ICD-10-CM | POA: Diagnosis not present

## 2015-08-30 DIAGNOSIS — Z8719 Personal history of other diseases of the digestive system: Secondary | ICD-10-CM | POA: Diagnosis not present

## 2015-08-30 LAB — COMPREHENSIVE METABOLIC PANEL
ALT: 38 U/L (ref 14–54)
AST: 28 U/L (ref 15–41)
Albumin: 4.5 g/dL (ref 3.5–5.0)
Alkaline Phosphatase: 75 U/L (ref 38–126)
Anion gap: 9 (ref 5–15)
BUN: 18 mg/dL (ref 6–20)
CO2: 23 mmol/L (ref 22–32)
Calcium: 9.5 mg/dL (ref 8.9–10.3)
Chloride: 107 mmol/L (ref 101–111)
Creatinine, Ser: 0.79 mg/dL (ref 0.44–1.00)
GFR calc Af Amer: >60 mL/min (ref >60)
GFR calc non Af Amer: >60 mL/min (ref >60)
Glucose, Bld: 120 mg/dL — ABNORMAL HIGH (ref 65–99)
Potassium: 3.7 mmol/L (ref 3.5–5.1)
Sodium: 139 mmol/L (ref 135–145)
Total Bilirubin: 0.7 mg/dL (ref 0.3–1.2)
Total Protein: 7.4 g/dL (ref 6.5–8.1)

## 2015-08-30 LAB — CBC WITH DIFFERENTIAL/PLATELET
Basophils Absolute: 0 10*3/uL (ref 0.0–0.1)
Basophils Relative: 1 %
Eosinophils Absolute: 0.1 10*3/uL (ref 0.0–0.7)
Eosinophils Relative: 2 %
HCT: 42.7 % (ref 36.0–46.0)
Hemoglobin: 14.7 g/dL (ref 12.0–15.0)
Lymphocytes Relative: 23 %
Lymphs Abs: 1.7 10*3/uL (ref 0.7–4.0)
MCH: 33.2 pg (ref 26.0–34.0)
MCHC: 34.4 g/dL (ref 30.0–36.0)
MCV: 96.4 fL (ref 78.0–100.0)
Monocytes Absolute: 0.6 10*3/uL (ref 0.1–1.0)
Monocytes Relative: 8 %
Neutro Abs: 5 10*3/uL (ref 1.7–7.7)
Neutrophils Relative %: 66 %
Platelets: 263 10*3/uL (ref 150–400)
RBC: 4.43 MIL/uL (ref 3.87–5.11)
RDW: 12.3 % (ref 11.5–15.5)
WBC: 7.4 10*3/uL (ref 4.0–10.5)

## 2015-08-30 LAB — URINE MICROSCOPIC-ADD ON

## 2015-08-30 LAB — LIPASE, BLOOD: Lipase: 41 U/L (ref 11–51)

## 2015-08-30 LAB — URINALYSIS, ROUTINE W REFLEX MICROSCOPIC
Bilirubin Urine: NEGATIVE
Glucose, UA: NEGATIVE mg/dL
Hgb urine dipstick: NEGATIVE
Ketones, ur: NEGATIVE mg/dL
Nitrite: NEGATIVE
Protein, ur: NEGATIVE mg/dL
Specific Gravity, Urine: 1.016 (ref 1.005–1.030)
Urobilinogen, UA: 1 mg/dL (ref 0.0–1.0)
pH: 6.5 (ref 5.0–8.0)

## 2015-08-30 LAB — I-STAT CHEM 8, ED
BUN: 17 mg/dL (ref 6–20)
CHLORIDE: 105 mmol/L (ref 101–111)
CREATININE: 0.9 mg/dL (ref 0.44–1.00)
Calcium, Ion: 1.17 mmol/L (ref 1.12–1.23)
GLUCOSE: 119 mg/dL — AB (ref 65–99)
HEMATOCRIT: 43 % (ref 36.0–46.0)
Hemoglobin: 14.6 g/dL (ref 12.0–15.0)
POTASSIUM: 3.7 mmol/L (ref 3.5–5.1)
Sodium: 139 mmol/L (ref 135–145)
TCO2: 21 mmol/L (ref 0–100)

## 2015-08-30 MED ORDER — HYDROCODONE-ACETAMINOPHEN 5-325 MG PO TABS: 1.0000 | ORAL_TABLET | Freq: Four times a day (QID) | ORAL | Status: AC | PRN

## 2015-08-30 NOTE — Discharge Instructions (Signed)
Return here as needed.  Follow-up with the GI Dr. provided °

## 2015-08-30 NOTE — ED Notes (Signed)
C/o right side abd pain that feels like GB pain

## 2015-08-30 NOTE — ED Provider Notes (Signed)
CSN: [id number]     Arrival date & time 08/30/15  1217 History   First MD Initiated Contact with Patient 08/30/15 1300     Chief Complaint  Patient presents with  . Abdominal Pain     (Consider location/radiation/quality/duration/timing/severity/associated sxs/prior Treatment) HPI Patient presents to the emergency department with right upper abdominal pain that started last night.  The patient states that she has had this intermittently in the past and has been evaluated no signs of gallstones.  She states that she ate some onions last night and that seems to make this pain worse.  She does have a history of GERD, but does not take any medications for this.  Patient denies chest pain, shortness of breath, weakness, dizziness, headache, blurred vision, vomiting, diarrhea, hematemesis, bloody stool, fever or syncope.  The patient states that nothing seems make her condition better.  Palpation makes the pain worse Past Medical History  Diagnosis Date  . Allergy   . Hypertension   . Hyperlipidemia   . Migraines    History reviewed. No pertinent past surgical history. Family History  Problem Relation Age of Onset  . Cancer Mother   . Cancer Father   . Diabetes Father   . Hyperlipidemia Father   . Hypertension Father    Social History  Substance Use Topics  . Smoking status: Never Smoker   . Smokeless tobacco: None  . Alcohol Use: No   OB History    No data available     Review of Systems  All other systems negative except as documented in the HPI. All pertinent positives and negatives as reviewed in the HPI.  Allergies  Review of patient's allergies indicates no known allergies.  Home Medications   Prior to Admission medications   Medication Sig Start Date End Date Taking? Authorizing Provider  PROMETHAZINE HCL PO Take by mouth.   Yes Historical Provider, MD  Blood Glucose Monitoring Suppl (ONE TOUCH ULTRA SYSTEM KIT) W/DEVICE KIT 1 kit by Does not apply route once.  10/28/14   Posey Boyer, MD  CALCIUM PO Take 2 tablets by mouth daily.    Historical Provider, MD  Ibuprofen (ADVIL MIGRAINE) 200 MG CAPS Take 400 mg by mouth every 4 (four) hours as needed (migraine).    Historical Provider, MD  lisinopril-hydrochlorothiazide (PRINZIDE,ZESTORETIC) 20-25 MG per tablet Take 1 tablet by mouth daily. 10/28/14   Posey Boyer, MD  metFORMIN (GLUCOPHAGE) 500 MG tablet Take 1 tablet (500 mg total) by mouth 2 (two) times daily with a meal. 10/28/14   Posey Boyer, MD  Multiple Vitamin (MULTIVITAMIN WITH MINERALS) TABS tablet Take 1 tablet by mouth daily.    Historical Provider, MD  pravastatin (PRAVACHOL) 40 MG tablet Take 1 tablet (40 mg total) by mouth daily. 10/28/14   Posey Boyer, MD  Pseudoephedrine-Acetaminophen (TYLENOL SINUS MAX ST PO) Take 2 tablets by mouth every 4 (four) hours as needed (sinuses).    Historical Provider, MD  topiramate (TOPAMAX) 50 MG tablet Take one tab daily for three days.  Then 1 BID for 3 days.  Then increase by 50 mg every three days to max of 200 bid 09/22/14   Roselee Culver, MD   BP 121/87 mmHg  Pulse 93  Temp(Src) 98.3 F (36.8 C) (Oral)  Resp 20  Ht '5\' 4"'  (1.626 m)  Wt 277 lb (125.646 kg)  BMI 47.52 kg/m2 Physical Exam  Constitutional: She is oriented to person, place, and time. She appears well-developed and  well-nourished. No distress.  HENT:  Head: Normocephalic and atraumatic.  Mouth/Throat: Oropharynx is clear and moist.  Eyes: Pupils are equal, round, and reactive to light.  Neck: Normal range of motion. Neck supple.  Cardiovascular: Normal rate, regular rhythm and normal heart sounds.  Exam reveals no gallop and no friction rub.   No murmur heard. Pulmonary/Chest: Effort normal and breath sounds normal. No respiratory distress. She has no wheezes.  Abdominal: Soft. Normal appearance and bowel sounds are normal. She exhibits no distension. There is tenderness in the right upper quadrant. There is no  rebound, no guarding, no CVA tenderness and negative Murphy's sign. No hernia.  Neurological: She is alert and oriented to person, place, and time. She exhibits normal muscle tone. Coordination normal.  Skin: Skin is warm and dry. No rash noted. No erythema.  Psychiatric: She has a normal mood and affect. Her behavior is normal.  Nursing note and vitals reviewed.   ED Course  Procedures (including critical care time) Labs Review Labs Reviewed  COMPREHENSIVE METABOLIC PANEL - Abnormal; Notable for the following:    Glucose, Bld 120 (*)    All other components within normal limits  URINALYSIS, ROUTINE W REFLEX MICROSCOPIC (NOT AT [facility]) - Abnormal; Notable for the following:    Leukocytes, UA TRACE (*)    All other components within normal limits  URINE MICROSCOPIC-ADD ON - Abnormal; Notable for the following:    Bacteria, UA FEW (*)    All other components within normal limits  I-STAT CHEM 8, ED - Abnormal; Notable for the following:    Glucose, Bld 119 (*)    All other components within normal limits  LIPASE, BLOOD  CBC WITH DIFFERENTIAL/PLATELET    Imaging Review US Abdomen Complete  08/30/2015  CLINICAL DATA:  Right upper and epigastric pain x12 hours EXAM: COMPLETE ABDOMINAL ULTRASOUND FINDINGS: Gallbladder: Physiologically distended without stones, wall thickening, or pericholecystic fluid. Sonographer reports no sonographic Murphy's sign. Common bile duct:  Normal in caliber, 92m diameter. Liver: Homogeneous in echotexture without focal lesion or intrahepatic bile duct dilatation. IVC:  Negative Pancreas:  Negative Spleen:  No focal lesion, craniocaudal 9.6cm in length. Right Kidney:  No mass or hydronephrosis, 10.8cm in length. Left Kidney:  No lesion or hydronephrosis, 12.3cm in length. Abdominal aorta:  Negative IMPRESSION: Negative.  Normal gallbladder. Electronically Signed   By: DLucrezia EuropeM.D.   On: 08/30/2015 15:40   I have personally reviewed and evaluated these images  and lab results as part of my medical decision-making.  Patient will be referred to GI for further evaluation of the gallbladder.  The patient is advised plan and all questions were answered.  I do not have a definitive diagnosis for her right upper quadrant pain at this time.  She is advised return here as needed     CDalia Heading PA-C 08/30/15 1[place] MD 08/30/15 [phone]

## 2019-06-07 DIAGNOSIS — N289 Disorder of kidney and ureter, unspecified: Secondary | ICD-10-CM | POA: Diagnosis not present

## 2019-06-08 ENCOUNTER — Other Ambulatory Visit (HOSPITAL_COMMUNITY): Payer: Self-pay | Admitting: Family Medicine

## 2019-06-08 ENCOUNTER — Other Ambulatory Visit: Payer: Self-pay | Admitting: Family Medicine

## 2019-06-08 DIAGNOSIS — K760 Fatty (change of) liver, not elsewhere classified: Secondary | ICD-10-CM

## 2019-06-08 DIAGNOSIS — N289 Disorder of kidney and ureter, unspecified: Secondary | ICD-10-CM

## 2019-06-08 DIAGNOSIS — R7989 Other specified abnormal findings of blood chemistry: Secondary | ICD-10-CM

## 2019-06-10 ENCOUNTER — Ambulatory Visit (HOSPITAL_COMMUNITY): Payer: BLUE CROSS/BLUE SHIELD

## 2019-06-14 ENCOUNTER — Other Ambulatory Visit: Payer: Self-pay

## 2019-06-14 ENCOUNTER — Ambulatory Visit (HOSPITAL_COMMUNITY)
Admission: RE | Admit: 2019-06-14 | Discharge: 2019-06-14 | Disposition: A | Discharge status: Home / Self Care | Payer: BC Managed Care – PPO | Source: Ambulatory Visit | Attending: Family Medicine | Admitting: Family Medicine

## 2019-06-14 ENCOUNTER — Encounter (HOSPITAL_COMMUNITY): Payer: Self-pay

## 2019-06-14 ENCOUNTER — Other Ambulatory Visit (HOSPITAL_COMMUNITY): Payer: Self-pay | Admitting: Family Medicine

## 2019-06-14 DIAGNOSIS — R7989 Other specified abnormal findings of blood chemistry: Secondary | ICD-10-CM | POA: Diagnosis not present

## 2019-06-14 DIAGNOSIS — K760 Fatty (change of) liver, not elsewhere classified: Secondary | ICD-10-CM

## 2019-06-14 DIAGNOSIS — R945 Abnormal results of liver function studies: Secondary | ICD-10-CM | POA: Diagnosis not present

## 2019-06-14 DIAGNOSIS — N289 Disorder of kidney and ureter, unspecified: Secondary | ICD-10-CM

## 2019-06-15 DIAGNOSIS — N289 Disorder of kidney and ureter, unspecified: Secondary | ICD-10-CM | POA: Diagnosis not present

## 2019-06-15 DIAGNOSIS — K760 Fatty (change of) liver, not elsewhere classified: Secondary | ICD-10-CM | POA: Diagnosis not present

## 2019-06-15 DIAGNOSIS — Z0184 Encounter for antibody response examination: Secondary | ICD-10-CM | POA: Diagnosis not present

## 2019-06-15 DIAGNOSIS — R7989 Other specified abnormal findings of blood chemistry: Secondary | ICD-10-CM | POA: Diagnosis not present

## 2019-07-19 DIAGNOSIS — E119 Type 2 diabetes mellitus without complications: Secondary | ICD-10-CM | POA: Diagnosis not present

## 2019-07-19 DIAGNOSIS — K760 Fatty (change of) liver, not elsewhere classified: Secondary | ICD-10-CM | POA: Diagnosis not present

## 2019-07-19 DIAGNOSIS — N289 Disorder of kidney and ureter, unspecified: Secondary | ICD-10-CM | POA: Diagnosis not present

## 2019-07-19 DIAGNOSIS — Z23 Encounter for immunization: Secondary | ICD-10-CM | POA: Diagnosis not present

## 2021-03-29 DIAGNOSIS — Z118 Encounter for screening for other infectious and parasitic diseases: Secondary | ICD-10-CM | POA: Diagnosis not present

## 2021-03-29 DIAGNOSIS — Z113 Encounter for screening for infections with a predominantly sexual mode of transmission: Secondary | ICD-10-CM | POA: Diagnosis not present

## 2021-03-29 DIAGNOSIS — Z7253 High risk bisexual behavior: Secondary | ICD-10-CM | POA: Diagnosis not present

## 2021-04-22 DIAGNOSIS — E1169 Type 2 diabetes mellitus with other specified complication: Secondary | ICD-10-CM | POA: Diagnosis not present

## 2021-04-22 DIAGNOSIS — L659 Nonscarring hair loss, unspecified: Secondary | ICD-10-CM | POA: Diagnosis not present

## 2021-04-22 DIAGNOSIS — E782 Mixed hyperlipidemia: Secondary | ICD-10-CM | POA: Diagnosis not present

## 2021-04-22 DIAGNOSIS — Z79899 Other long term (current) drug therapy: Secondary | ICD-10-CM | POA: Diagnosis not present

## 2021-04-22 DIAGNOSIS — I10 Essential (primary) hypertension: Secondary | ICD-10-CM | POA: Diagnosis not present

## 2021-04-25 ENCOUNTER — Other Ambulatory Visit: Payer: Self-pay | Admitting: Family Medicine

## 2021-04-25 DIAGNOSIS — Z1231 Encounter for screening mammogram for malignant neoplasm of breast: Secondary | ICD-10-CM

## 2021-06-20 DIAGNOSIS — E1165 Type 2 diabetes mellitus with hyperglycemia: Secondary | ICD-10-CM | POA: Diagnosis not present

## 2021-08-01 ENCOUNTER — Other Ambulatory Visit: Payer: Self-pay

## 2021-08-01 ENCOUNTER — Ambulatory Visit
Admission: RE | Admit: 2021-08-01 | Discharge: 2021-08-01 | Disposition: A | Discharge status: Home / Self Care | Payer: BC Managed Care – PPO | Source: Ambulatory Visit | Attending: Family Medicine | Admitting: Family Medicine

## 2021-08-01 DIAGNOSIS — Z1231 Encounter for screening mammogram for malignant neoplasm of breast: Secondary | ICD-10-CM | POA: Diagnosis not present

## 2021-09-19 DIAGNOSIS — E11319 Type 2 diabetes mellitus with unspecified diabetic retinopathy without macular edema: Secondary | ICD-10-CM | POA: Diagnosis not present

## 2021-11-14 DIAGNOSIS — I10 Essential (primary) hypertension: Secondary | ICD-10-CM | POA: Diagnosis not present

## 2021-11-14 DIAGNOSIS — E559 Vitamin D deficiency, unspecified: Secondary | ICD-10-CM | POA: Diagnosis not present

## 2021-11-14 DIAGNOSIS — Z0000 Encounter for general adult medical examination without abnormal findings: Secondary | ICD-10-CM | POA: Diagnosis not present

## 2021-11-14 DIAGNOSIS — Z124 Encounter for screening for malignant neoplasm of cervix: Secondary | ICD-10-CM | POA: Diagnosis not present

## 2021-11-14 DIAGNOSIS — E782 Mixed hyperlipidemia: Secondary | ICD-10-CM | POA: Diagnosis not present

## 2021-11-14 DIAGNOSIS — E1169 Type 2 diabetes mellitus with other specified complication: Secondary | ICD-10-CM | POA: Diagnosis not present

## 2022-02-06 DIAGNOSIS — E1169 Type 2 diabetes mellitus with other specified complication: Secondary | ICD-10-CM | POA: Diagnosis not present

## 2022-02-06 DIAGNOSIS — E782 Mixed hyperlipidemia: Secondary | ICD-10-CM | POA: Diagnosis not present

## 2022-02-06 DIAGNOSIS — I10 Essential (primary) hypertension: Secondary | ICD-10-CM | POA: Diagnosis not present

## 2022-04-06 IMAGING — MG MM DIGITAL SCREENING BILAT W/ TOMO AND CAD
8 series · 8 of 24 positions shown · non-contrast
Comparison: Previous exam(s).

ACR Breast Density Category a: The breast tissue is almost entirely
fatty.

CLINICAL DATA: Screening.

EXAM:
DIGITAL SCREENING BILATERAL MAMMOGRAM WITH TOMOSYNTHESIS AND CAD
TECHNIQUE: Bilateral screening digital craniocaudal and mediolateral oblique
mammograms were obtained. Bilateral screening digital breast
tomosynthesis was performed. The images were evaluated with
computer-aided detection.

[R MLO synth-2D]
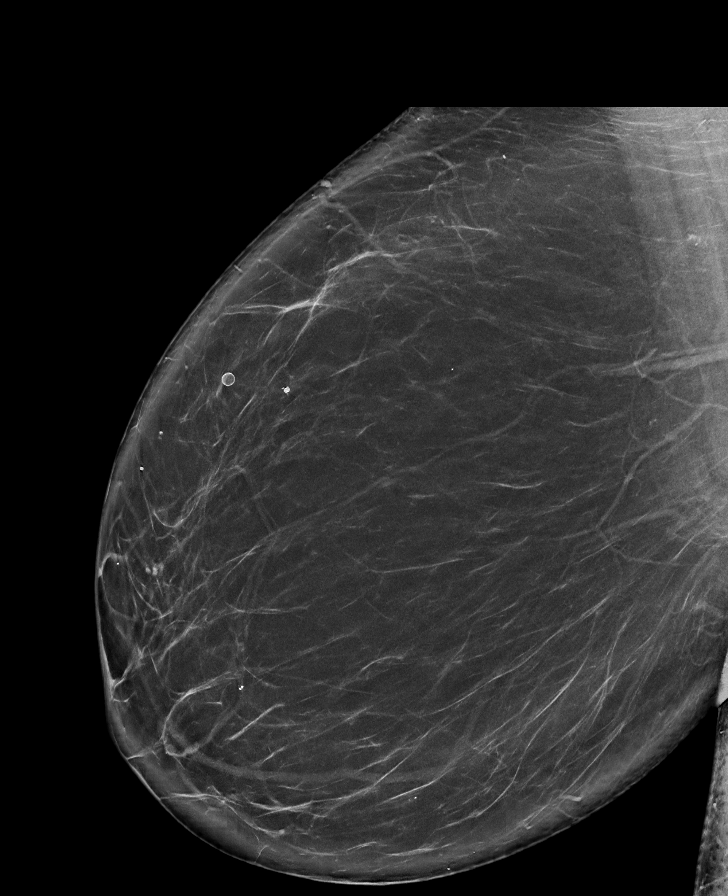

[L MLO synth-2D]
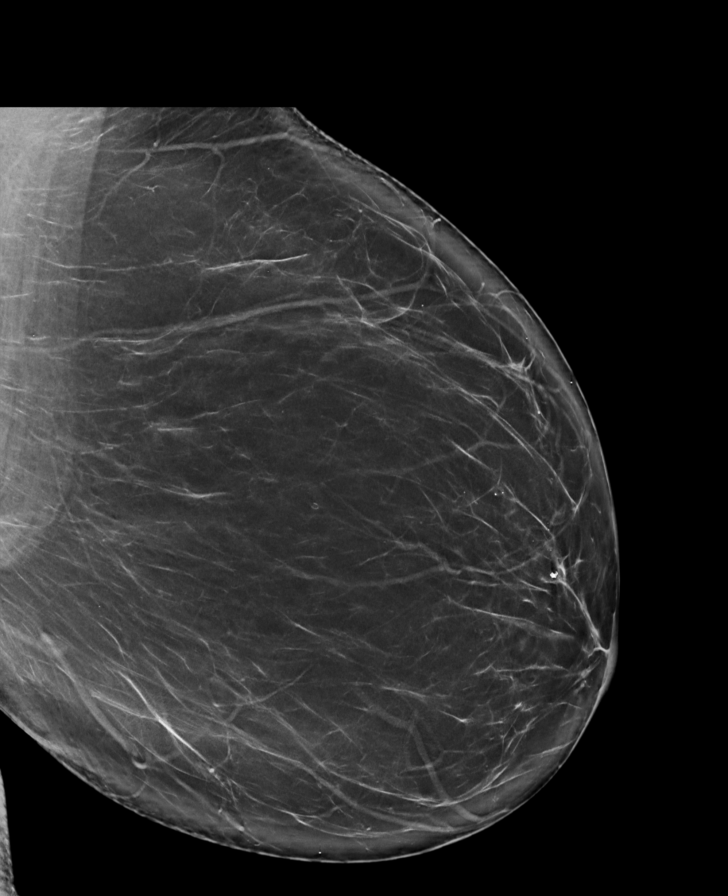

[L CC synth-2D]
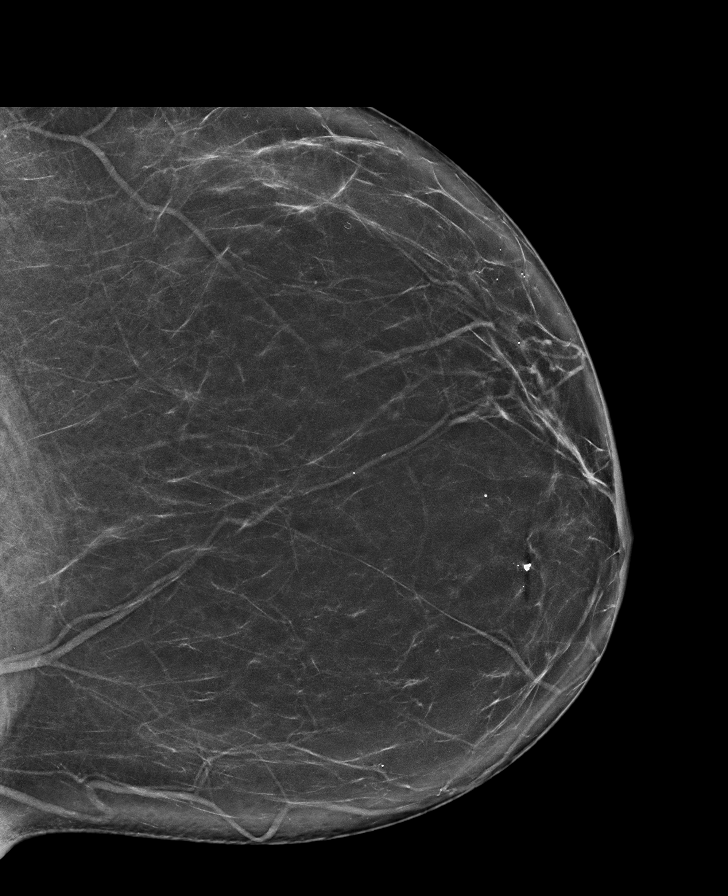

[R CC synth-2D]
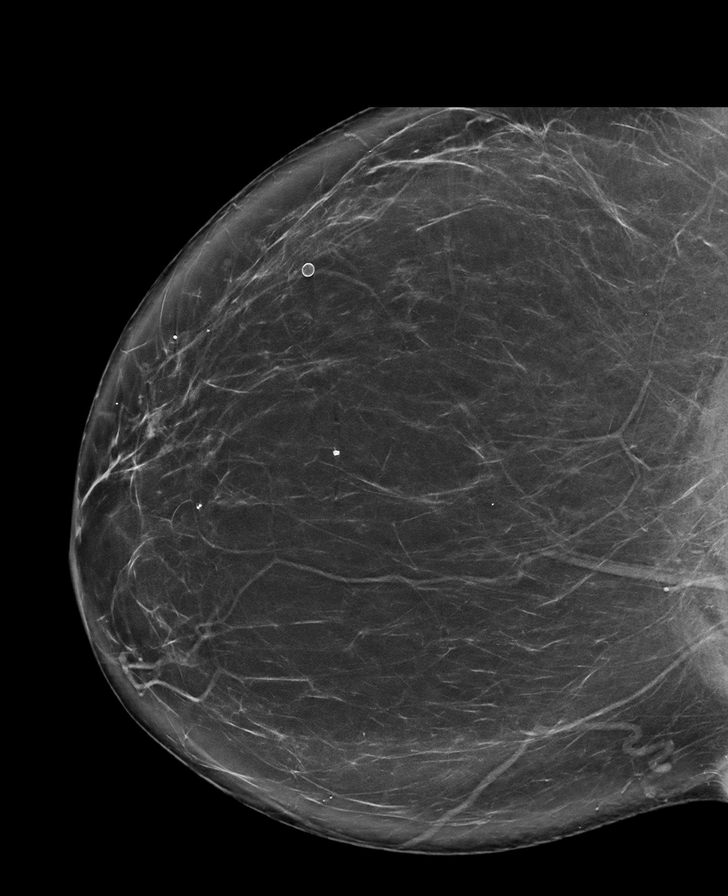

[L CC tomo · tomo slice 45/88.0]
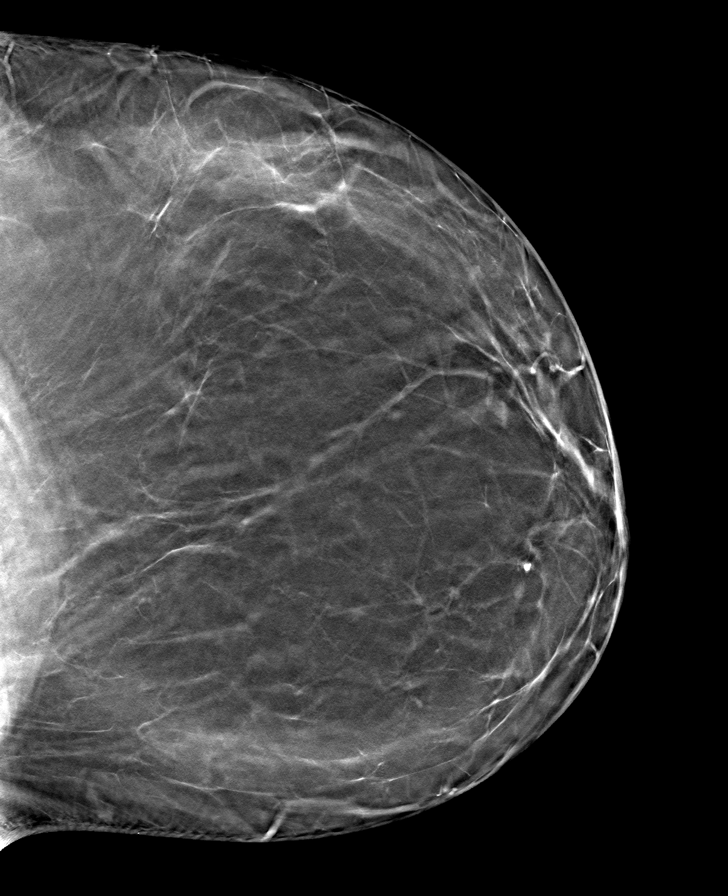

[R MLO tomo · tomo slice 52/103.0]
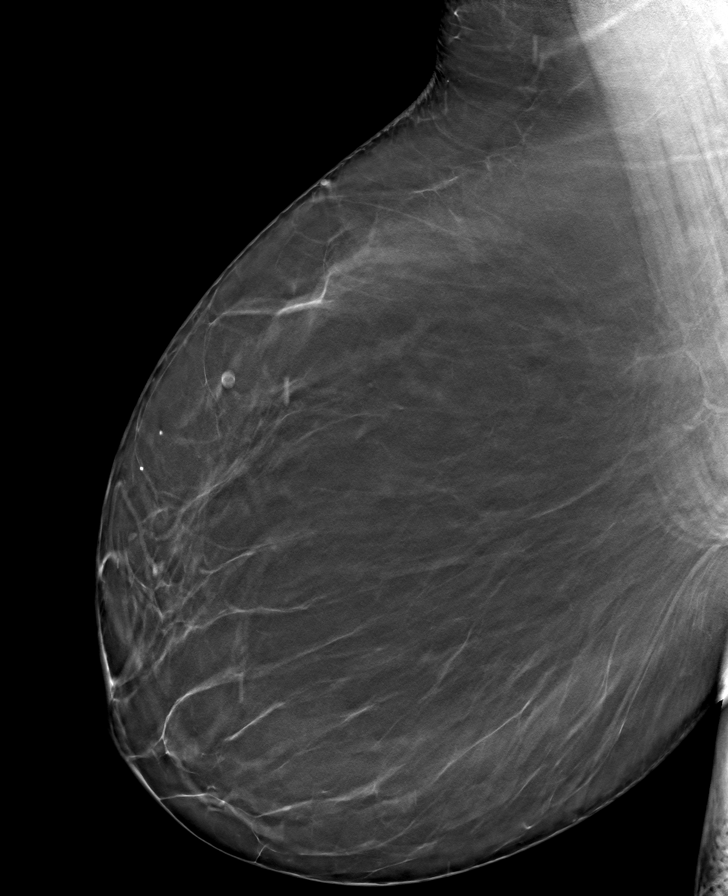

[L MLO tomo · tomo slice 50/99.0]
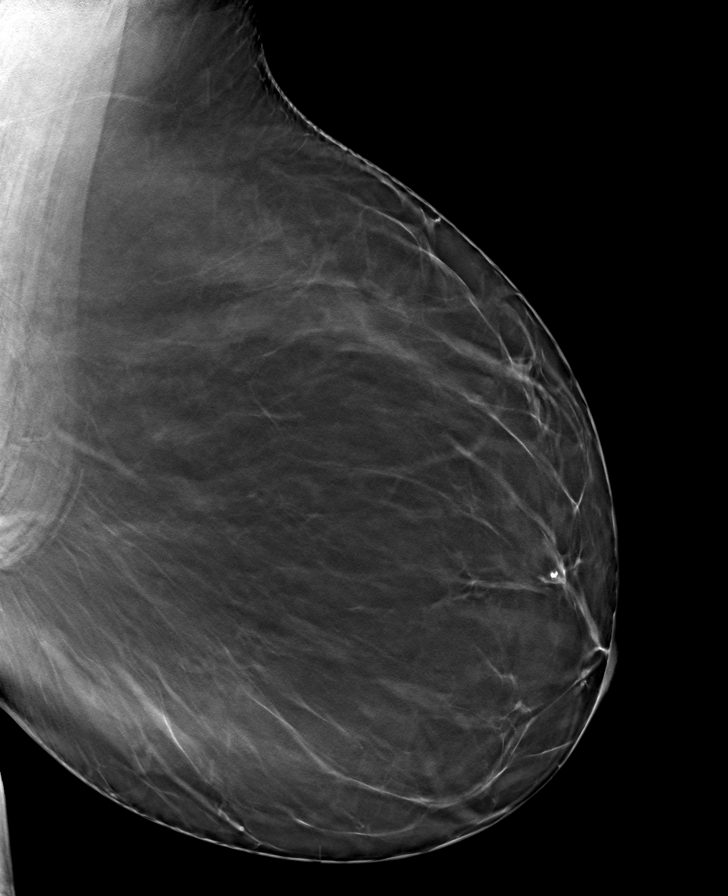

[R CC tomo · tomo slice 45/90.0]
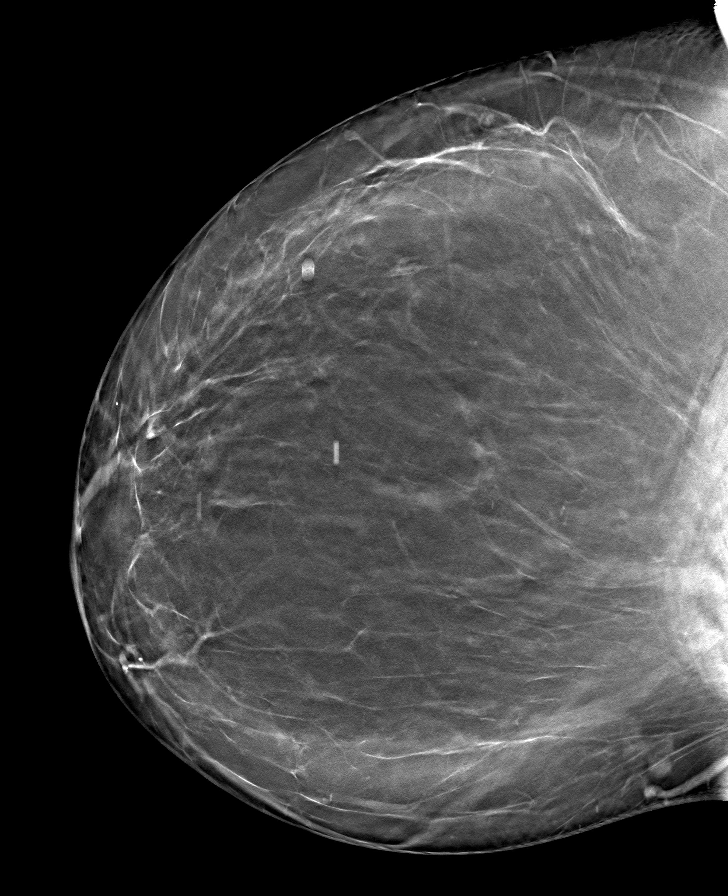

[8 of 24 positions shown; findings below may reference images not displayed]

FINDINGS: There are no findings suspicious for malignancy.
IMPRESSION: No mammographic evidence of malignancy. A result letter of this
screening mammogram will be mailed directly to the patient.

RECOMMENDATION:
Screening mammogram in one year. (Code:0E-3-N98)

BI-RADS CATEGORY  1: Negative.

## 2022-07-09 DIAGNOSIS — E559 Vitamin D deficiency, unspecified: Secondary | ICD-10-CM | POA: Diagnosis not present

## 2022-07-09 DIAGNOSIS — E782 Mixed hyperlipidemia: Secondary | ICD-10-CM | POA: Diagnosis not present

## 2022-07-09 DIAGNOSIS — I10 Essential (primary) hypertension: Secondary | ICD-10-CM | POA: Diagnosis not present

## 2022-07-09 DIAGNOSIS — Z79899 Other long term (current) drug therapy: Secondary | ICD-10-CM | POA: Diagnosis not present

## 2022-07-09 DIAGNOSIS — E1169 Type 2 diabetes mellitus with other specified complication: Secondary | ICD-10-CM | POA: Diagnosis not present

## 2022-10-13 DIAGNOSIS — L658 Other specified nonscarring hair loss: Secondary | ICD-10-CM | POA: Diagnosis not present

## 2023-01-09 ENCOUNTER — Other Ambulatory Visit: Payer: Self-pay | Admitting: Family Medicine

## 2023-01-09 DIAGNOSIS — E782 Mixed hyperlipidemia: Secondary | ICD-10-CM | POA: Diagnosis not present

## 2023-01-09 DIAGNOSIS — Z0000 Encounter for general adult medical examination without abnormal findings: Secondary | ICD-10-CM | POA: Diagnosis not present

## 2023-01-09 DIAGNOSIS — Z23 Encounter for immunization: Secondary | ICD-10-CM | POA: Diagnosis not present

## 2023-01-09 DIAGNOSIS — I10 Essential (primary) hypertension: Secondary | ICD-10-CM | POA: Diagnosis not present

## 2023-01-09 DIAGNOSIS — Z79899 Other long term (current) drug therapy: Secondary | ICD-10-CM | POA: Diagnosis not present

## 2023-01-09 DIAGNOSIS — E559 Vitamin D deficiency, unspecified: Secondary | ICD-10-CM | POA: Diagnosis not present

## 2023-01-09 DIAGNOSIS — E1169 Type 2 diabetes mellitus with other specified complication: Secondary | ICD-10-CM | POA: Diagnosis not present

## 2023-01-09 DIAGNOSIS — Z1231 Encounter for screening mammogram for malignant neoplasm of breast: Secondary | ICD-10-CM

## 2023-01-26 DIAGNOSIS — B3731 Acute candidiasis of vulva and vagina: Secondary | ICD-10-CM | POA: Diagnosis not present

## 2023-03-18 ENCOUNTER — Ambulatory Visit
Admission: RE | Admit: 2023-03-18 | Discharge: 2023-03-18 | Disposition: A | Discharge status: Home / Self Care | Payer: BC Managed Care – PPO | Source: Ambulatory Visit | Attending: Family Medicine | Admitting: Family Medicine

## 2023-03-18 DIAGNOSIS — Z1231 Encounter for screening mammogram for malignant neoplasm of breast: Secondary | ICD-10-CM

## 2023-05-01 DIAGNOSIS — R5383 Other fatigue: Secondary | ICD-10-CM | POA: Diagnosis not present

## 2023-05-01 DIAGNOSIS — Z862 Personal history of diseases of the blood and blood-forming organs and certain disorders involving the immune mechanism: Secondary | ICD-10-CM | POA: Diagnosis not present

## 2023-07-24 DIAGNOSIS — F333 Major depressive disorder, recurrent, severe with psychotic symptoms: Secondary | ICD-10-CM | POA: Diagnosis not present

## 2023-07-24 DIAGNOSIS — F419 Anxiety disorder, unspecified: Secondary | ICD-10-CM | POA: Diagnosis not present

## 2023-07-24 DIAGNOSIS — F840 Autistic disorder: Secondary | ICD-10-CM | POA: Diagnosis not present

## 2023-07-24 DIAGNOSIS — R45851 Suicidal ideations: Secondary | ICD-10-CM | POA: Diagnosis not present

## 2023-07-24 DIAGNOSIS — F909 Attention-deficit hyperactivity disorder, unspecified type: Secondary | ICD-10-CM | POA: Diagnosis not present

## 2023-07-24 DIAGNOSIS — Z803 Family history of malignant neoplasm of breast: Secondary | ICD-10-CM | POA: Diagnosis not present

## 2023-07-24 DIAGNOSIS — F4312 Post-traumatic stress disorder, chronic: Secondary | ICD-10-CM | POA: Diagnosis not present

## 2023-07-24 DIAGNOSIS — G4700 Insomnia, unspecified: Secondary | ICD-10-CM | POA: Diagnosis not present

## 2023-07-24 DIAGNOSIS — F323 Major depressive disorder, single episode, severe with psychotic features: Secondary | ICD-10-CM | POA: Diagnosis not present

## 2023-07-24 DIAGNOSIS — Z833 Family history of diabetes mellitus: Secondary | ICD-10-CM | POA: Diagnosis not present

## 2023-07-24 DIAGNOSIS — Z9151 Personal history of suicidal behavior: Secondary | ICD-10-CM | POA: Diagnosis not present

## 2023-07-24 DIAGNOSIS — Z8049 Family history of malignant neoplasm of other genital organs: Secondary | ICD-10-CM | POA: Diagnosis not present

## 2023-07-24 DIAGNOSIS — Z62810 Personal history of physical and sexual abuse in childhood: Secondary | ICD-10-CM | POA: Diagnosis not present

## 2023-07-24 DIAGNOSIS — Z818 Family history of other mental and behavioral disorders: Secondary | ICD-10-CM | POA: Diagnosis not present

## 2023-07-24 DIAGNOSIS — Z9013 Acquired absence of bilateral breasts and nipples: Secondary | ICD-10-CM | POA: Diagnosis not present

## 2023-07-24 DIAGNOSIS — Z8041 Family history of malignant neoplasm of ovary: Secondary | ICD-10-CM | POA: Diagnosis not present

## 2023-07-24 DIAGNOSIS — F640 Transsexualism: Secondary | ICD-10-CM | POA: Diagnosis not present

## 2023-07-25 DIAGNOSIS — F323 Major depressive disorder, single episode, severe with psychotic features: Secondary | ICD-10-CM | POA: Diagnosis not present

## 2023-07-26 DIAGNOSIS — F323 Major depressive disorder, single episode, severe with psychotic features: Secondary | ICD-10-CM | POA: Diagnosis not present

## 2023-07-27 DIAGNOSIS — F323 Major depressive disorder, single episode, severe with psychotic features: Secondary | ICD-10-CM | POA: Diagnosis not present

## 2023-07-28 DIAGNOSIS — F323 Major depressive disorder, single episode, severe with psychotic features: Secondary | ICD-10-CM | POA: Diagnosis not present

## 2023-07-29 DIAGNOSIS — F323 Major depressive disorder, single episode, severe with psychotic features: Secondary | ICD-10-CM | POA: Diagnosis not present

## 2023-08-05 DIAGNOSIS — F640 Transsexualism: Secondary | ICD-10-CM | POA: Diagnosis not present

## 2023-08-05 DIAGNOSIS — Z79890 Hormone replacement therapy (postmenopausal): Secondary | ICD-10-CM | POA: Diagnosis not present

## 2023-08-05 DIAGNOSIS — Z818 Family history of other mental and behavioral disorders: Secondary | ICD-10-CM | POA: Diagnosis not present

## 2023-08-05 DIAGNOSIS — R45851 Suicidal ideations: Secondary | ICD-10-CM | POA: Diagnosis not present

## 2023-08-05 DIAGNOSIS — R440 Auditory hallucinations: Secondary | ICD-10-CM | POA: Diagnosis not present

## 2023-08-05 DIAGNOSIS — Z23 Encounter for immunization: Secondary | ICD-10-CM | POA: Diagnosis not present

## 2023-08-05 DIAGNOSIS — Z885 Allergy status to narcotic agent status: Secondary | ICD-10-CM | POA: Diagnosis not present

## 2023-08-05 DIAGNOSIS — F4310 Post-traumatic stress disorder, unspecified: Secondary | ICD-10-CM | POA: Diagnosis not present

## 2023-08-05 DIAGNOSIS — Z8049 Family history of malignant neoplasm of other genital organs: Secondary | ICD-10-CM | POA: Diagnosis not present

## 2023-08-05 DIAGNOSIS — Z8041 Family history of malignant neoplasm of ovary: Secondary | ICD-10-CM | POA: Diagnosis not present

## 2023-08-05 DIAGNOSIS — Z9013 Acquired absence of bilateral breasts and nipples: Secondary | ICD-10-CM | POA: Diagnosis not present

## 2023-08-05 DIAGNOSIS — F323 Major depressive disorder, single episode, severe with psychotic features: Secondary | ICD-10-CM | POA: Diagnosis not present

## 2023-08-05 DIAGNOSIS — F329 Major depressive disorder, single episode, unspecified: Secondary | ICD-10-CM | POA: Diagnosis not present

## 2023-08-05 DIAGNOSIS — Z833 Family history of diabetes mellitus: Secondary | ICD-10-CM | POA: Diagnosis not present

## 2023-08-05 DIAGNOSIS — F419 Anxiety disorder, unspecified: Secondary | ICD-10-CM | POA: Diagnosis not present

## 2023-08-05 DIAGNOSIS — G4700 Insomnia, unspecified: Secondary | ICD-10-CM | POA: Diagnosis not present

## 2023-08-05 DIAGNOSIS — F429 Obsessive-compulsive disorder, unspecified: Secondary | ICD-10-CM | POA: Diagnosis not present

## 2023-08-06 DIAGNOSIS — F323 Major depressive disorder, single episode, severe with psychotic features: Secondary | ICD-10-CM | POA: Diagnosis not present
